# Patient Record
Sex: Female | Born: 1950 | Race: White | Hispanic: No | Smoking: Former smoker
Health system: Southern US, Community
[De-identification: ages and names within clinical notes are randomized; demographics above are authoritative.]

## PROBLEM LIST (undated history)

## (undated) DIAGNOSIS — G4730 Sleep apnea, unspecified: Secondary | ICD-10-CM

## (undated) DIAGNOSIS — J45909 Unspecified asthma, uncomplicated: Secondary | ICD-10-CM

## (undated) DIAGNOSIS — C439 Malignant melanoma of skin, unspecified: Secondary | ICD-10-CM

## (undated) DIAGNOSIS — Z972 Presence of dental prosthetic device (complete) (partial): Secondary | ICD-10-CM

## (undated) DIAGNOSIS — M549 Dorsalgia, unspecified: Secondary | ICD-10-CM

## (undated) DIAGNOSIS — M1990 Unspecified osteoarthritis, unspecified site: Secondary | ICD-10-CM

## (undated) DIAGNOSIS — I10 Essential (primary) hypertension: Secondary | ICD-10-CM

## (undated) HISTORY — PX: BREAST SURGERY: SHX581

## (undated) HISTORY — PX: BREAST BIOPSY: SHX20

## (undated) HISTORY — PX: TONSILLECTOMY: SUR1361

## (undated) HISTORY — PX: FISSURECTOMY: SHX5244

## (undated) HISTORY — PX: KNEE SURGERY: SHX244

---

## 1993-09-07 HISTORY — PX: BREAST EXCISIONAL BIOPSY: SUR124

## 2013-01-26 ENCOUNTER — Ambulatory Visit: Payer: Self-pay | Admitting: Nurse Practitioner

## 2013-02-03 ENCOUNTER — Ambulatory Visit: Payer: Self-pay | Admitting: Nurse Practitioner

## 2013-02-22 ENCOUNTER — Ambulatory Visit: Payer: Self-pay | Admitting: Nurse Practitioner

## 2013-03-20 ENCOUNTER — Ambulatory Visit: Payer: Self-pay | Admitting: Gastroenterology

## 2013-03-21 LAB — PATHOLOGY REPORT

## 2013-08-20 ENCOUNTER — Ambulatory Visit: Payer: Self-pay

## 2014-01-01 ENCOUNTER — Ambulatory Visit: Payer: Self-pay | Admitting: Unknown Physician Specialty

## 2014-03-02 ENCOUNTER — Ambulatory Visit: Payer: Self-pay | Admitting: Unknown Physician Specialty

## 2014-08-09 DIAGNOSIS — C439 Malignant melanoma of skin, unspecified: Secondary | ICD-10-CM

## 2014-08-09 DIAGNOSIS — D039 Melanoma in situ, unspecified: Secondary | ICD-10-CM | POA: Insufficient documentation

## 2014-08-09 HISTORY — DX: Malignant melanoma of skin, unspecified: C43.9

## 2014-08-21 ENCOUNTER — Ambulatory Visit: Payer: Self-pay | Admitting: Nurse Practitioner

## 2014-08-28 ENCOUNTER — Ambulatory Visit: Payer: Self-pay | Admitting: Nurse Practitioner

## 2015-07-10 ENCOUNTER — Other Ambulatory Visit: Payer: Self-pay | Admitting: Gastroenterology

## 2015-07-10 DIAGNOSIS — R1030 Lower abdominal pain, unspecified: Secondary | ICD-10-CM

## 2015-07-15 ENCOUNTER — Ambulatory Visit
Admission: RE | Admit: 2015-07-15 | Discharge: 2015-07-15 | Disposition: A | Discharge status: Home / Self Care | Payer: Medicare Other | Source: Ambulatory Visit | Attending: Gastroenterology | Admitting: Gastroenterology

## 2015-07-15 DIAGNOSIS — R1030 Lower abdominal pain, unspecified: Secondary | ICD-10-CM | POA: Insufficient documentation

## 2015-07-27 ENCOUNTER — Ambulatory Visit
Admission: EM | Admit: 2015-07-27 | Discharge: 2015-07-27 | Disposition: A | Discharge status: Home / Self Care | Payer: Medicare Other | Attending: Internal Medicine | Admitting: Internal Medicine

## 2015-07-27 ENCOUNTER — Encounter: Payer: Self-pay | Admitting: Gynecology

## 2015-07-27 DIAGNOSIS — M545 Low back pain, unspecified: Secondary | ICD-10-CM

## 2015-07-27 HISTORY — DX: Unspecified asthma, uncomplicated: J45.909

## 2015-07-27 HISTORY — DX: Dorsalgia, unspecified: M54.9

## 2015-07-27 HISTORY — DX: Essential (primary) hypertension: I10

## 2015-07-27 LAB — URINALYSIS COMPLETE WITH MICROSCOPIC (ARMC ONLY)
Bilirubin Urine: NEGATIVE
GLUCOSE, UA: NEGATIVE mg/dL
KETONES UR: NEGATIVE mg/dL
Leukocytes, UA: NEGATIVE
Nitrite: NEGATIVE
Protein, ur: NEGATIVE mg/dL
SPECIFIC GRAVITY, URINE: 1.025 (ref 1.005–1.030)
pH: 5 (ref 5.0–8.0)

## 2015-07-27 NOTE — Discharge Instructions (Signed)
Sacroiliac Joint Dysfunction Sacroiliac joint dysfunction is a condition that causes inflammation on one or both sides of the sacroiliac (SI) joint. The SI joint connects the lower part of the spine (sacrum) with the two upper portions of the pelvis (ilium). This condition causes deep aching or burning pain in the low back. In some cases, the pain may also spread into one or both buttocks or hips or spread down the legs. CAUSES This condition may be caused by:  Pregnancy. During pregnancy, extra stress is put on the SI joints because the pelvis widens.  Injury, such as:  Car accidents.  Sport-related injuries.  Work-related injuries.  Having one leg that is shorter than the other.  Conditions that affect the joints, such as:  Rheumatoid arthritis.  Gout.  Psoriatic arthritis.  Joint infection (septic arthritis). Sometimes, the cause of SI joint dysfunction is not known. SYMPTOMS Symptoms of this condition include:  Aching or burning pain in the lower back. The pain may also spread to other areas, such as:  Buttocks.  Groin.  Thighs and legs.  Muscle spasms in or around the painful areas.  Increased pain when standing, walking, running, stair climbing, bending, or lifting. DIAGNOSIS Your health care provider will do a physical exam and take your medical history. During the exam, the health care provider may move one or both of your legs to different positions to check for pain. Various tests may be done to help verify the diagnosis, including:  Imaging tests to look for other causes of pain. These may include:  MRI.  CT scan.  Bone scan.  Diagnostic injection. A numbing medicine is injected into the SI joint using a needle. If the pain is temporarily improved or stopped after the injection, this can indicate that SI joint dysfunction is the problem. TREATMENT Treatment may vary depending on the cause and severity of your condition. Treatment options may  include:  Applying ice or heat to the lower back area. This can help to reduce pain and muscle spasms.  Medicines to relieve pain or inflammation or to relax the muscles.  Wearing a back brace (sacroiliac brace) to help support the joint while your back is healing.  Physical therapy to increase muscle strength around the joint and flexibility at the joint. This may also involve learning proper body positions and ways of moving to relieve stress on the joint.  Direct manipulation of the SI joint.  Injections of steroid medicine into the joint in order to reduce pain and swelling.  Radiofrequency ablation to burn away nerves that are carrying pain messages from the joint.  Use of a device that provides electrical stimulation in order to reduce pain at the joint.  Surgery to put in screws and plates that limit or prevent joint motion. This is rare. HOME CARE INSTRUCTIONS  Rest as needed. Limit your activities as directed by your health care provider.  Take medicines only as directed by your health care provider.  If directed, apply ice to the affected area:  Put ice in a plastic bag.  Place a towel between your skin and the bag.  Leave the ice on for 20 minutes, 2-3 times per day.  Use a heating pad or a moist heat pack as directed by your health care provider.  Exercise as directed by your health care provider or physical therapist.  Keep all follow-up visits as directed by your health care provider. This is important. SEEK MEDICAL CARE IF:  Your pain is not controlled   with medicine.  You have a fever.  You have increasingly severe pain. SEEK IMMEDIATE MEDICAL CARE IF:  You have weakness, numbness, or tingling in your legs or feet.  You lose control of your bladder or bowel.   This information is not intended to replace advice given to you by your health care provider. Make sure you discuss any questions you have with your health care provider.   Document Released:  11/20/2008 Document Revised: 01/08/2015 Document Reviewed: 05/01/2014 Elsevier Interactive Patient Education 2016 [employer]  

## 2015-07-27 NOTE — ED Notes (Signed)
Patient c/o lower back pain x this am.

## 2015-07-29 LAB — URINE CULTURE

## 2015-07-31 ENCOUNTER — Encounter: Payer: Self-pay | Admitting: Physician Assistant

## 2015-07-31 NOTE — ED Provider Notes (Signed)
CSN: [id number]     Arrival date & time 07/27/15  1509 History   First MD Initiated Contact with Patient 07/27/15 1738     Chief Complaint  Patient presents with  . Back Pain   (Consider location/radiation/quality/duration/timing/severity/associated sxs/prior Treatment) HPI 64 yo F presents concerned about low back pain present since this morning.Denies any episode of  trauma- Aggravated that she is experiencing for "no reason". On closer review her son was to come help with yard chores yesterday and his plans changed.   Ms. Goldsborough then opted to move the large potted plants that were on the porch into the wheelbarrow and then into the house. She later used the push mower to mow the lawn and blow the leaves away. Today she awakened with low back discomfort, particularly aware of the right side- though denies numbness, tingling or paresthesia. She is right hand dominant. Has history of previous low back ailments as well as asthma and HTN. Has used a heating pad with some relief . Has taken cyclobenzaprine and "oxy-something" that she has kept "in stockpile " in her closet from previous low back episodes. No frequency or dysuria ,no N/V/D. No saddle paresthesia for difficulty with bowel or bladder function  Past Medical History  Diagnosis Date  . Hypertension   . Asthma   . Back pain   . Back pain    Past Surgical History  Procedure Laterality Date  . Tonsillectomy    . Fissurectomy    . Cesarean section    . Breast surgery    . Knee surgery     No family history on file. Social History  Substance Use Topics  . Smoking status: Never Smoker   . Smokeless tobacco: None  . Alcohol Use: Yes   OB History    Gravida Para Term Preterm AB TAB SAB Ectopic Multiple Living   1 1             Review of Systems Constitutional: No fever.  Eyes: No visual changes. ENT:No sore throat. Cardiovascular:Negative for chest pain/palpitations Respiratory: Negative for shortness of  breath Gastrointestinal: No abdominal pain. No nausea,vomiting, diarrhea Genitourinary: Negative for dysuria. Normal urination. Musculoskeletal: Positive for right low back pain. FROM extremities without pain; good cap fill; leg and foot negative Skin: Negative for rash Neurological: Negative for headache, focal weakness or numbness- no sensation abnormalitis  Allergies  Review of patient's allergies indicates no known allergies.  Home Medications   Prior to Admission medications   Medication Sig Start Date End Date Taking? Authorizing Provider  Acetaminophen (TYLENOL 8 HOUR PO) Take by mouth.   Yes Historical Provider, MD  cyclobenzaprine (FLEXERIL) 5 MG tablet Take 5 mg by mouth 3 (three) times daily as needed for muscle spasms.   Yes Historical Provider, MD  fluticasone (FLONASE) 50 MCG/ACT nasal spray Place into both nostrils daily.   Yes Historical Provider, MD  Fluticasone-Salmeterol (ADVAIR) 500-50 MCG/DOSE AEPB Inhale 1 puff into the lungs 2 (two) times daily.   Yes Historical Provider, MD  lisinopril (PRINIVIL,ZESTRIL) 30 MG tablet Take 30 mg by mouth daily.   Yes Historical Provider, MD   Meds Ordered and Administered this Visit  Medications - No data to display  BP 120/77 mmHg  Pulse 73  Temp(Src) 98.2 F (36.8 C) (Oral)  Resp 18  Ht 5\' 2"  (1.575 m)  Wt 222 lb (100.699 kg)  BMI 40.59 kg/m2  SpO2 98% No data found.   Physical Exam  General: NAD, does not appear  toxic HEENT:normocephalic,atraumatic, mucous membranes moist,grossly normal hearing Eyes: EOMI, conjunctiva clear, conjugate gaze Neck: supple,no lymphadenopathy Resp : CT A, bilat; normal respiratory effort Back : No CVAT, no paraspinal tenderness; right, low back at pelvic rim palpation elicits mild tenderness, R >L. DTRs and pulses present and equal. SLR negative Card : RRR Abd:  Not distended Skin: no rash, skin intact MSK: no deformities, ambulatory without assistance.can toe walk and heel walk, flex  at waist and toe touch to halfway. Neuro : good attention,recall-good memory, no focal neuro deficits Psych: speech and behavior appropriate   ED Course  Procedures (including critical care time)  Labs Review Labs Reviewed  URINALYSIS COMPLETEWITH MICROSCOPIC ([place]) - Abnormal; Notable for the following:    Hgb urine dipstick TRACE (*)    Bacteria, UA FEW (*)    Squamous Epithelial / LPF 0-5 (*)    All other components within normal limits  URINE CULTURE    Imaging Review No results found.  Urine negative for infection... Time and tylenol...limited activitiy , advancing as tolerated Low back precautions reviewed--Ice encouraged, heat to follow -  Body mechanics reviewed May use pain meds from previous experiences as needeed/directed Do not drive with pain meds... Advance activities as able without aggravating discomfort- no rotation and weight lifting Anti-inflammatory encouraged greater than narcotic intervention TC MMUC or PCP is persists greater than 2-3 weeks even with careful bodymechanics        MDM   1. Right-sided low back pain without sciatica   Plan: Diagnosis reviewed with patient/parent/guardian/caregiver  Rx as per orders;  benefits, risks, potential side effects reviewed   Recommend supportive treatment with cyclic tylenol and ibuprofen Seek additional medical care if symptoms worsen or are not improving     Jan Fireman, PA-C 07/31/15 2132

## 2015-08-21 ENCOUNTER — Other Ambulatory Visit: Payer: Self-pay | Admitting: Nurse Practitioner

## 2015-08-21 DIAGNOSIS — Z1231 Encounter for screening mammogram for malignant neoplasm of breast: Secondary | ICD-10-CM

## 2015-08-26 ENCOUNTER — Other Ambulatory Visit: Payer: Self-pay | Admitting: Gastroenterology

## 2015-08-26 DIAGNOSIS — R1031 Right lower quadrant pain: Secondary | ICD-10-CM

## 2015-08-26 DIAGNOSIS — R1032 Left lower quadrant pain: Secondary | ICD-10-CM

## 2015-08-29 ENCOUNTER — Ambulatory Visit
Admission: RE | Admit: 2015-08-29 | Discharge: 2015-08-29 | Disposition: A | Discharge status: Home / Self Care | Payer: Medicare Other | Source: Ambulatory Visit | Attending: Gastroenterology | Admitting: Gastroenterology

## 2015-08-29 DIAGNOSIS — K5730 Diverticulosis of large intestine without perforation or abscess without bleeding: Secondary | ICD-10-CM | POA: Insufficient documentation

## 2015-08-29 DIAGNOSIS — R1032 Left lower quadrant pain: Secondary | ICD-10-CM

## 2015-08-29 DIAGNOSIS — R1031 Right lower quadrant pain: Secondary | ICD-10-CM | POA: Diagnosis present

## 2015-08-29 HISTORY — DX: Malignant melanoma of skin, unspecified: C43.9

## 2015-08-29 MED ORDER — IOHEXOL 350 MG/ML SOLN
100.0000 mL | Freq: Once | INTRAVENOUS | Status: AC | PRN
  Administered 2015-08-29: 100 mL via INTRAVENOUS

## 2015-09-11 ENCOUNTER — Ambulatory Visit
Admission: RE | Admit: 2015-09-11 | Discharge: 2015-09-11 | Disposition: A | Discharge status: Home / Self Care | Payer: Medicare Other | Source: Ambulatory Visit | Attending: Nurse Practitioner | Admitting: Nurse Practitioner

## 2015-09-11 DIAGNOSIS — Z1231 Encounter for screening mammogram for malignant neoplasm of breast: Secondary | ICD-10-CM

## 2015-09-11 DIAGNOSIS — N63 Unspecified lump in breast: Secondary | ICD-10-CM | POA: Insufficient documentation

## 2015-09-12 ENCOUNTER — Other Ambulatory Visit: Payer: Self-pay | Admitting: Nurse Practitioner

## 2015-09-12 DIAGNOSIS — N63 Unspecified lump in unspecified breast: Secondary | ICD-10-CM

## 2015-09-17 DIAGNOSIS — F32A Depression, unspecified: Secondary | ICD-10-CM | POA: Insufficient documentation

## 2015-09-17 DIAGNOSIS — I10 Essential (primary) hypertension: Secondary | ICD-10-CM | POA: Insufficient documentation

## 2015-09-24 ENCOUNTER — Ambulatory Visit: Admission: RE | Admit: 2015-09-24 | Payer: Medicare Other | Source: Ambulatory Visit

## 2015-09-24 ENCOUNTER — Other Ambulatory Visit: Payer: Medicare Other

## 2015-10-08 ENCOUNTER — Ambulatory Visit
Admission: RE | Admit: 2015-10-08 | Discharge: 2015-10-08 | Disposition: A | Discharge status: Home / Self Care | Payer: Medicare Other | Source: Ambulatory Visit | Attending: Nurse Practitioner | Admitting: Nurse Practitioner

## 2015-10-08 DIAGNOSIS — N63 Unspecified lump in unspecified breast: Secondary | ICD-10-CM

## 2015-10-08 HISTORY — PX: BREAST BIOPSY: SHX20

## 2015-10-09 LAB — SURGICAL PATHOLOGY

## 2015-11-22 ENCOUNTER — Encounter: Payer: Self-pay | Admitting: Anesthesiology

## 2015-11-22 ENCOUNTER — Ambulatory Visit
Admission: RE | Admit: 2015-11-22 | Discharge: 2015-11-22 | Disposition: A | Discharge status: Home / Self Care | Payer: Medicare Other | Source: Ambulatory Visit | Attending: Gastroenterology | Admitting: Gastroenterology

## 2015-11-22 ENCOUNTER — Encounter
Admission: RE | Disposition: A | Discharge status: Home / Self Care | Payer: Self-pay | Source: Ambulatory Visit | Attending: Gastroenterology

## 2015-11-22 ENCOUNTER — Ambulatory Visit: Payer: Medicare Other | Admitting: Anesthesiology

## 2015-11-22 DIAGNOSIS — J45909 Unspecified asthma, uncomplicated: Secondary | ICD-10-CM | POA: Diagnosis not present

## 2015-11-22 DIAGNOSIS — K621 Rectal polyp: Secondary | ICD-10-CM | POA: Diagnosis not present

## 2015-11-22 DIAGNOSIS — K5730 Diverticulosis of large intestine without perforation or abscess without bleeding: Secondary | ICD-10-CM | POA: Diagnosis not present

## 2015-11-22 DIAGNOSIS — Z79899 Other long term (current) drug therapy: Secondary | ICD-10-CM | POA: Diagnosis not present

## 2015-11-22 DIAGNOSIS — R1032 Left lower quadrant pain: Secondary | ICD-10-CM | POA: Insufficient documentation

## 2015-11-22 DIAGNOSIS — Z85820 Personal history of malignant melanoma of skin: Secondary | ICD-10-CM | POA: Diagnosis not present

## 2015-11-22 DIAGNOSIS — Z9889 Other specified postprocedural states: Secondary | ICD-10-CM | POA: Insufficient documentation

## 2015-11-22 DIAGNOSIS — I10 Essential (primary) hypertension: Secondary | ICD-10-CM | POA: Insufficient documentation

## 2015-11-22 DIAGNOSIS — Z7951 Long term (current) use of inhaled steroids: Secondary | ICD-10-CM | POA: Insufficient documentation

## 2015-11-22 HISTORY — PX: COLONOSCOPY WITH PROPOFOL: SHX5780

## 2015-11-22 SURGERY — COLONOSCOPY WITH PROPOFOL
Anesthesia: General

## 2015-11-22 MED ORDER — SODIUM CHLORIDE 0.9 % IV SOLN
INTRAVENOUS | Status: DC
  Administered 2015-11-22 (×2): via INTRAVENOUS

## 2015-11-22 MED ORDER — PROPOFOL 500 MG/50ML IV EMUL
INTRAVENOUS | Status: DC | PRN
  Administered 2015-11-22: 180 ug/kg/min via INTRAVENOUS

## 2015-11-22 MED ORDER — FENTANYL CITRATE (PF) 100 MCG/2ML IJ SOLN
INTRAMUSCULAR | Status: DC | PRN
  Administered 2015-11-22: 50 ug via INTRAVENOUS

## 2015-11-22 MED ORDER — EPHEDRINE SULFATE 50 MG/ML IJ SOLN
INTRAMUSCULAR | Status: DC | PRN
  Administered 2015-11-22 (×2): 10 mg via INTRAVENOUS

## 2015-11-22 MED ORDER — SODIUM CHLORIDE 0.9 % IV SOLN
INTRAVENOUS | Status: DC
Start: 2015-11-22 — End: 2015-11-22

## 2015-11-22 MED ORDER — MIDAZOLAM HCL 5 MG/5ML IJ SOLN
INTRAMUSCULAR | Status: DC | PRN
  Administered 2015-11-22: 2 mg via INTRAVENOUS

## 2015-11-22 MED ORDER — LIDOCAINE HCL (CARDIAC) 20 MG/ML IV SOLN
INTRAVENOUS | Status: DC | PRN
  Administered 2015-11-22: 40 mg via INTRAVENOUS

## 2015-11-22 MED ORDER — PHENYLEPHRINE HCL 10 MG/ML IJ SOLN
INTRAMUSCULAR | Status: DC | PRN
  Administered 2015-11-22: 100 ug via INTRAVENOUS

## 2015-11-22 MED ORDER — PROPOFOL 10 MG/ML IV BOLUS
INTRAVENOUS | Status: DC | PRN
  Administered 2015-11-22: 50 mg via INTRAVENOUS

## 2015-11-22 NOTE — Transfer of Care (Signed)
Immediate Anesthesia Transfer of Care Note  Patient: Terri Hamilton  Procedure(s) Performed: Procedure(s): COLONOSCOPY WITH PROPOFOL (N/A)  Patient Location: PACU  Anesthesia Type:General  Level of Consciousness: awake  Airway & Oxygen Therapy: Patient Spontanous Breathing and Patient connected to nasal cannula oxygen  Post-op Assessment: Report given to RN and Post -op Vital signs reviewed and stable  Post vital signs: Reviewed and stable  Last Vitals:  Filed Vitals:   11/22/15 0912 11/22/15 1017  BP: 153/78 96/51  Pulse: 76 88  Temp: 36.9 C 35.9 C  Resp: 18 15    Complications: No apparent anesthesia complications

## 2015-11-22 NOTE — Op Note (Signed)
[facility] Gastroenterology Patient Name: Terri Hamilton Procedure Date: 11/22/2015 9:38 AM MRN: [id number] Account #: [id number] Date of Birth: 18-Jul-1951 Admit Type: Outpatient Age: 65 Room: [facility] ENDO ROOM 3 Gender: Female Note Status: Finalized Procedure:            Colonoscopy Indications:          Abdominal pain in the left lower quadrant, Abnormal CT                        of the GI tract Providers:            Lollie Sails, MD Referring MD:         Juluis Rainier (Referring MD) Medicines:            Monitored Anesthesia Care Complications:        No immediate complications. Procedure:            Pre-Anesthesia Assessment:                       - ASA Grade Assessment: III - A patient with severe                        systemic disease.                       After obtaining informed consent, the colonoscope was                        passed under direct vision. Throughout the procedure,                        the patient's blood pressure, pulse, and oxygen                        saturations were monitored continuously. The                        Colonoscope was introduced through the anus and                        advanced to the the cecum, identified by appendiceal                        orifice and ileocecal valve. The quality of the bowel                        preparation was good. Findings:      A 2 mm polyp was found in the rectum. The polyp was sessile. The polyp       was removed with a cold biopsy forceps. Resection and retrieval were       complete.      Multiple small and large-mouthed diverticula were found in the sigmoid       colon, descending colon and transverse colon.      A single medium-mouthed diverticulum was found in the mid sigmoid colon.       Peri-diverticular erythema was seen.      There is some mild lumenal narrowing in the distal to mid sigmoid.       without evidence of inflammation or overt stricture.      The  digital rectal exam was  normal. Pertinent negatives include note       poor sphhincter tone. Impression:           - One 2 mm polyp in the rectum, removed with a cold                        biopsy forceps. Resected and retrieved.                       - Diverticulosis in the sigmoid colon, in the                        descending colon and in the transverse colon.                       - Mild diverticulosis in the mid sigmoid colon.                        Peri-diverticular erythema was seen. Recommendation:       - Trial dose of immodium, 2 mg daily, and one dose of                        citrucel daily for reported fecal incontinence.                       - Return to GI clinic in 1 month. Procedure Code(s):    --- Professional ---                       [id number], Colonoscopy, flexible; with biopsy, single or                        multiple Diagnosis Code(s):    --- Professional ---                       K62.1, Rectal polyp                       R10.32, Left lower quadrant pain                       K57.30, Diverticulosis of large intestine without                        perforation or abscess without bleeding                       R93.3, Abnormal findings on diagnostic imaging of other                        parts of digestive tract CPT copyright 2016 American Medical Association. All rights reserved. The codes documented in this report are preliminary and upon coder review may  be revised to meet current compliance requirements. Lollie Sails, MD 11/22/2015 10:17:42 AM This report has been signed electronically. Number of Addenda: 0 Note Initiated On: 11/22/2015 9:38 AM Scope Withdrawal Time: 0 hours 8 minutes 7 seconds  Total Procedure Duration: 0 hours 17 minutes 59 seconds       [facility]

## 2015-11-22 NOTE — Anesthesia Postprocedure Evaluation (Signed)
Anesthesia Post Note  Patient: Terri Hamilton  Procedure(s) Performed: Procedure(s) (LRB): COLONOSCOPY WITH PROPOFOL (N/A)  Patient location during evaluation: Endoscopy Anesthesia Type: General Level of consciousness: awake and alert Pain management: pain level controlled Vital Signs Assessment: post-procedure vital signs reviewed and stable Respiratory status: spontaneous breathing, nonlabored ventilation, respiratory function stable and patient connected to nasal cannula oxygen Cardiovascular status: blood pressure returned to baseline and stable Postop Assessment: no signs of nausea or vomiting Anesthetic complications: no    Last Vitals:  Filed Vitals:   11/22/15 1030 11/22/15 1040  BP: 107/66 109/72  Pulse: 86   Temp:    Resp: 17     Last Pain:  Filed Vitals:   11/22/15 1049  PainSc: 4                  Ikeisha Blumberg K Luanna Weesner

## 2015-11-22 NOTE — Anesthesia Preprocedure Evaluation (Addendum)
Anesthesia Evaluation  Patient identified by MRN, date of birth, ID band Patient awake    Reviewed: Allergy & Precautions, H&P , NPO status , Patient's Chart, lab work & pertinent test results  History of Anesthesia Complications (+) AWARENESS UNDER ANESTHESIA and history of anesthetic complications  Airway Mallampati: II  TM Distance: <3 FB Neck ROM: limited    Dental  (+) Poor Dentition   Pulmonary neg shortness of breath, asthma , sleep apnea and Continuous Positive Airway Pressure Ventilation , former smoker,    Pulmonary exam normal breath sounds clear to auscultation       Cardiovascular Exercise Tolerance: Good hypertension, (-) angina(-) Past MI and (-) DOE Normal cardiovascular exam Rhythm:regular Rate:Normal     Neuro/Psych negative neurological ROS  negative psych ROS   GI/Hepatic Neg liver ROS, neg GERD  ,  Endo/Other  negative endocrine ROS  Renal/GU negative Renal ROS  negative genitourinary   Musculoskeletal   Abdominal   Peds  Hematology negative hematology ROS (+)   Anesthesia Other Findings Past Medical History:   Hypertension                                                 Asthma                                                       Back pain                                                    Back pain                                                    Melanoma ([place])                                  08/09/2014      Comment:Resected from Right side of face.  Past Surgical History:   TONSILLECTOMY                                                 FISSURECTOMY                                                  CESAREAN SECTION                                              BREAST SURGERY  KNEE SURGERY                                                  BREAST BIOPSY                                   Left                Comment:neg   BREAST BIOPSY                                    Bilateral 10/08/2015      Comment:path pending  BMI    Body Mass Index   40.59 kg/m 2      Reproductive/Obstetrics negative OB ROS                            Anesthesia Physical Anesthesia Plan  ASA: III  Anesthesia Plan: General   Post-op Pain Management:    Induction:   Airway Management Planned:   Additional Equipment:   Intra-op Plan:   Post-operative Plan:   Informed Consent: I have reviewed the patients History and Physical, chart, labs and discussed the procedure including the risks, benefits and alternatives for the proposed anesthesia with the patient or authorized representative who has indicated his/her understanding and acceptance.   Dental Advisory Given  Plan Discussed with: Anesthesiologist, CRNA and Surgeon  Anesthesia Plan Comments:         Anesthesia Quick Evaluation

## 2015-11-22 NOTE — H&P (Signed)
Outpatient short stay form Pre-procedure 11/22/2015 9:32 AM Lollie Sails MD  Primary Physician: Mercy Riding, NP  Reason for visit:  Colonoscopy  History of present illness:  Patient is a 65 year old female presenting today for colonoscopy. She has a history of recurrent diverticulitis and has been having some left lower quadrant pain. A CT scan was done on 08/29/2015 with a finding of some thickening in the sigmoid region without evidence of active diverticulitis. She tolerated her prep well. He takes no aspirin or blood thinning products.    Current facility-administered medications:  .  0.9 %  sodium chloride infusion, , Intravenous, Continuous, Lollie Sails, MD, Last Rate: 20 mL/hr at 11/22/15 0925 .  0.9 %  sodium chloride infusion, , Intravenous, Continuous, Lollie Sails, MD  Prescriptions prior to admission  Medication Sig Dispense Refill Last Dose  . escitalopram (LEXAPRO) 20 MG tablet Take 20 mg by mouth daily.   11/22/2015 at [place]  . fluticasone (FLONASE) 50 MCG/ACT nasal spray Place into both nostrils daily.   11/22/2015 at [place]  . Fluticasone-Salmeterol (ADVAIR) 500-50 MCG/DOSE AEPB Inhale 1 puff into the lungs 2 (two) times daily.   11/22/2015 at [place]  . lisinopril (PRINIVIL,ZESTRIL) 30 MG tablet Take 30 mg by mouth daily.   11/22/2015 at [place]  . Acetaminophen (TYLENOL 8 HOUR PO) Take by mouth.     . cyclobenzaprine (FLEXERIL) 5 MG tablet Take 5 mg by mouth 3 (three) times daily as needed for muscle spasms.        No Known Allergies   Past Medical History  Diagnosis Date  . Hypertension   . Asthma   . Back pain   . Back pain   . Melanoma ([place]) 08/09/2014    Resected from Right side of face.    Review of systems:      Physical Exam    Heart and lungs: Regular rate and rhythm without rub or gallop, lungs are bilaterally clear.    HEENT: Normocephalic atraumatic eyes are anicteric    Other:     Pertinant exam for procedure: Soft nontender  nondistended bowel sounds positive normoactive    Planned proceedures: Colonoscopy and indicated procedures. I have discussed the risks benefits and complications of procedures to include not limited to bleeding, infection, perforation and the risk of sedation and the patient wishes to proceed.    Lollie Sails, MD Gastroenterology 11/22/2015  9:32 AM

## 2015-11-25 LAB — SURGICAL PATHOLOGY

## 2015-11-27 ENCOUNTER — Encounter: Payer: Self-pay | Admitting: Gastroenterology

## 2016-08-24 ENCOUNTER — Other Ambulatory Visit: Payer: Self-pay | Admitting: Nurse Practitioner

## 2016-08-24 DIAGNOSIS — Z1231 Encounter for screening mammogram for malignant neoplasm of breast: Secondary | ICD-10-CM

## 2016-09-06 IMAGING — MG MM DIAG BREAST TOMO BILATERAL
8 of 12 series · 8 of 28 positions shown · non-contrast
Comparison: Previous exam(s).

CLINICAL DATA: Patient for short-term follow-up probably benign
left breast mass.

EXAM:
DIGITAL DIAGNOSTIC BILATERAL MAMMOGRAM WITH 3D TOMOSYNTHESIS WITH
CAD
ULTRASOUND BILATERAL BREAST

[R MLO synth-2D]
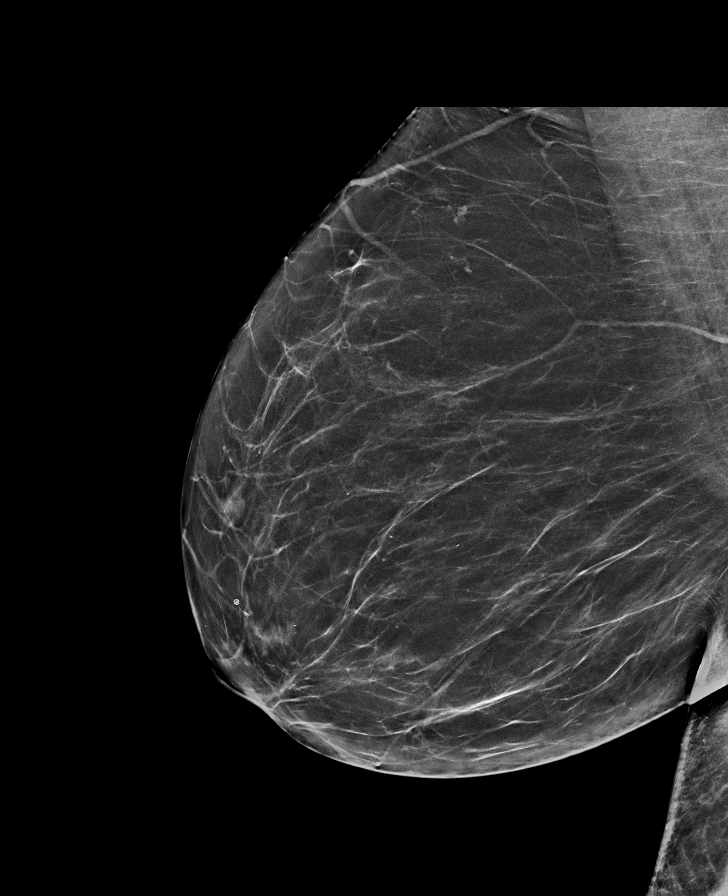

[L CC]
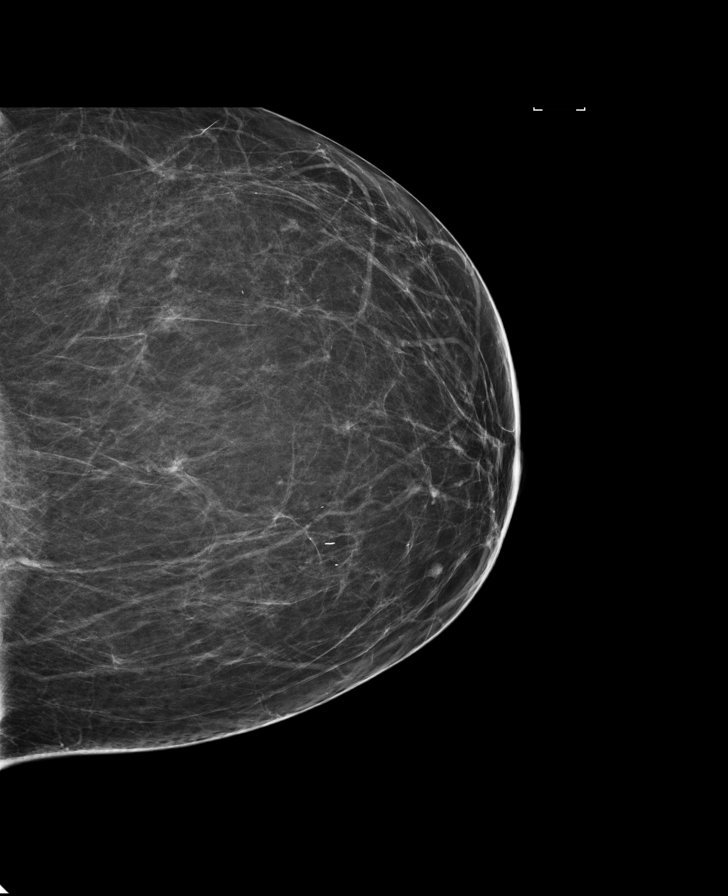

[R CC synth-2D]
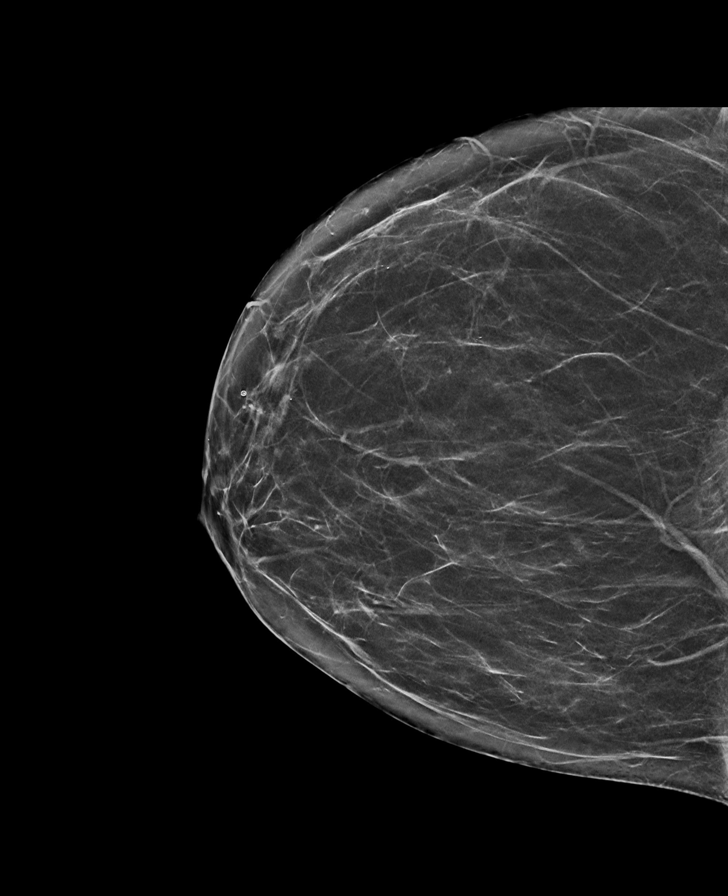

[R CC]
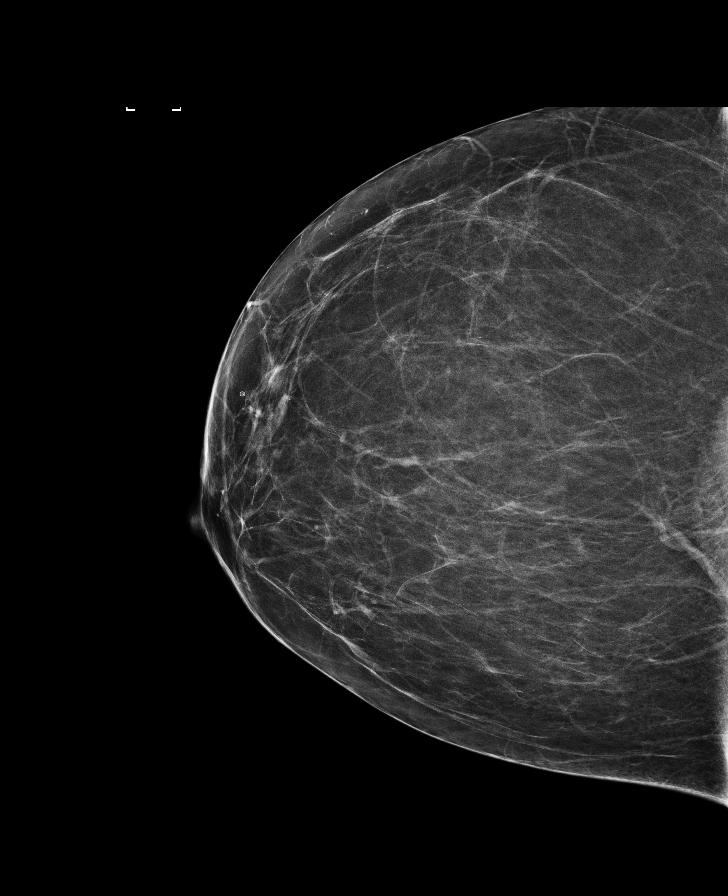

[L MLO synth-2D]
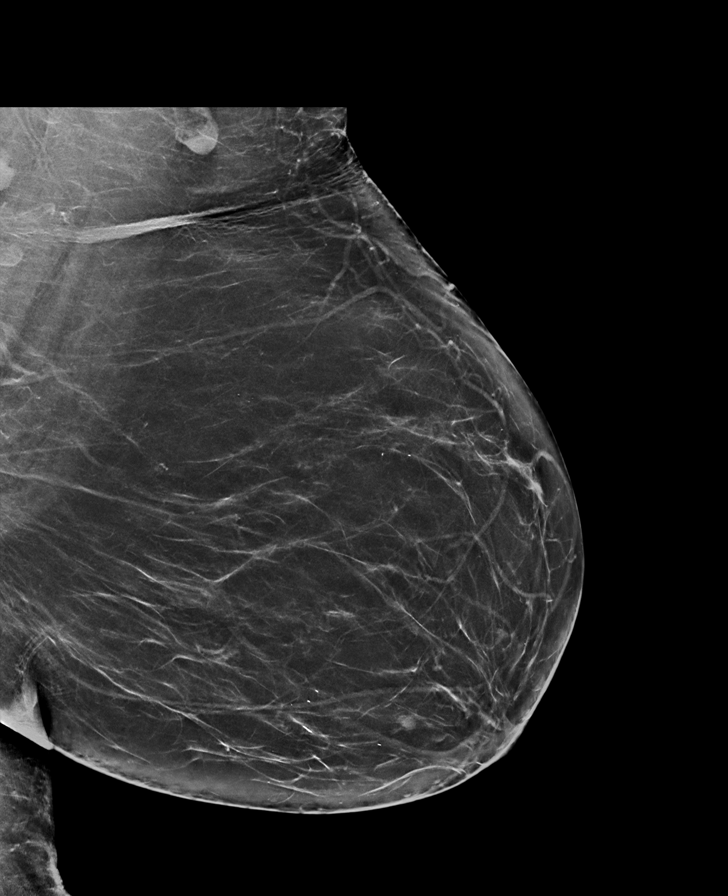

[L CC synth-2D]
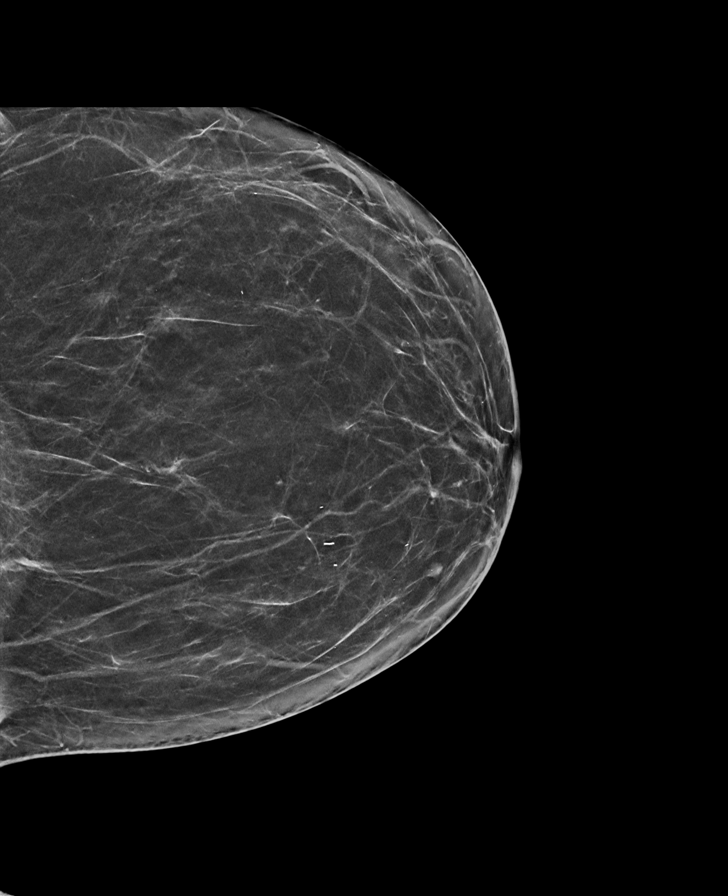

[R MLO]
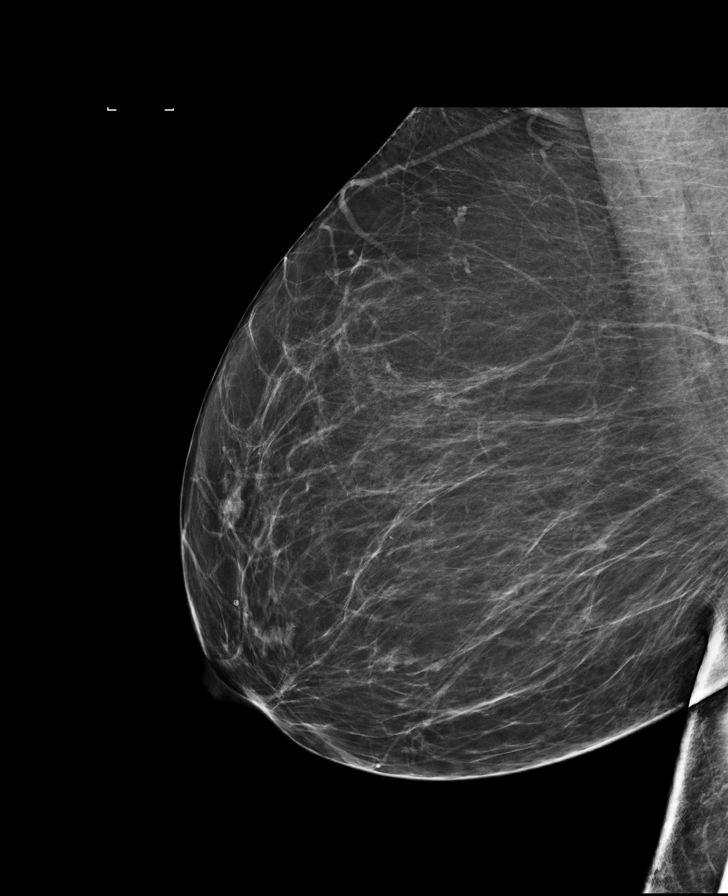

[L MLO]
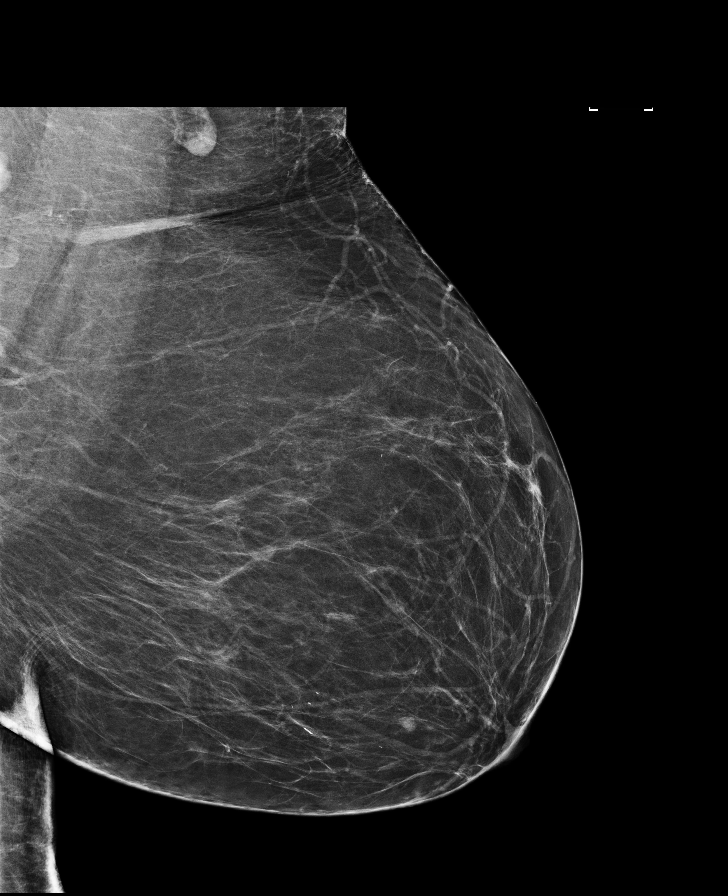

[8 of 28 positions shown; findings below may reference images not displayed]

ACR Breast Density Category b: There are scattered areas of
fibroglandular density.
FINDINGS: No significant interval change in size of oval circumscribed mass
within the upper inner left breast. Additionally within the inferior
right breast 6- 7 o'clock position there is a new oval circumscribed
mass measuring 8 mm. No additional concerning masses, calcifications
or architectural distortion identified within either breast.

Mammographic images were processed with CAD.

On physical exam, I palpate no discrete mass within the upper inner
left breast or inferior right breast.

Targeted ultrasound is performed, showing a 5 x 3 x 5 mm
circumscribed mixed echogenicity mass within the left breast 10
o'clock position 2 cm from nipple, not significantly changed in size
from prior however appears to be more solid when compared to prior
examination.

No left axillary lymphadenopathy.

Within the right breast 7 o'clock position 4 cm from the nipple
there is a 6 x 2 x 5 mm lobular hypoechoic mass with internal color
flow. No right axillary lymphadenopathy.
IMPRESSION: New indeterminate hypoechoic right breast mass.

Indeterminate hypoechoic left breast mass.

RECOMMENDATION:
Ultrasound-guided core needle biopsy new right breast mass.

Ultrasound-guided core needle biopsy solid left breast mass.

Biopsy will be scheduled at patient's convenience.

I have discussed the findings and recommendations with the patient.
Results were also provided in writing at the conclusion of the
visit. If applicable, a reminder letter will be sent to the patient
regarding the next appointment.

BI-RADS CATEGORY  4: Suspicious.

## 2017-08-03 ENCOUNTER — Other Ambulatory Visit: Payer: Self-pay | Admitting: Nurse Practitioner

## 2017-08-03 DIAGNOSIS — N6310 Unspecified lump in the right breast, unspecified quadrant: Secondary | ICD-10-CM

## 2017-09-10 ENCOUNTER — Ambulatory Visit
Admission: RE | Admit: 2017-09-10 | Discharge: 2017-09-10 | Disposition: A | Discharge status: Home / Self Care | Payer: Medicare Other | Source: Ambulatory Visit | Attending: Nurse Practitioner | Admitting: Nurse Practitioner

## 2017-09-10 DIAGNOSIS — N6310 Unspecified lump in the right breast, unspecified quadrant: Secondary | ICD-10-CM | POA: Diagnosis not present

## 2018-08-12 ENCOUNTER — Other Ambulatory Visit: Payer: Self-pay | Admitting: Nurse Practitioner

## 2018-08-12 DIAGNOSIS — Z1231 Encounter for screening mammogram for malignant neoplasm of breast: Secondary | ICD-10-CM

## 2018-09-13 ENCOUNTER — Ambulatory Visit
Admission: RE | Admit: 2018-09-13 | Discharge: 2018-09-13 | Disposition: A | Discharge status: Home / Self Care | Payer: Medicare Other | Source: Ambulatory Visit | Attending: Nurse Practitioner | Admitting: Nurse Practitioner

## 2018-09-13 DIAGNOSIS — Z1231 Encounter for screening mammogram for malignant neoplasm of breast: Secondary | ICD-10-CM | POA: Diagnosis present

## 2019-09-12 ENCOUNTER — Other Ambulatory Visit (INDEPENDENT_AMBULATORY_CARE_PROVIDER_SITE_OTHER): Payer: Self-pay | Admitting: Gastroenterology

## 2019-10-03 ENCOUNTER — Other Ambulatory Visit: Payer: Self-pay | Admitting: Nurse Practitioner

## 2019-10-03 DIAGNOSIS — Z1231 Encounter for screening mammogram for malignant neoplasm of breast: Secondary | ICD-10-CM

## 2019-10-31 ENCOUNTER — Ambulatory Visit
Admission: RE | Admit: 2019-10-31 | Discharge: 2019-10-31 | Disposition: A | Discharge status: Home / Self Care | Payer: Medicare Other | Source: Ambulatory Visit | Attending: Nurse Practitioner | Admitting: Nurse Practitioner

## 2019-10-31 DIAGNOSIS — Z1231 Encounter for screening mammogram for malignant neoplasm of breast: Secondary | ICD-10-CM | POA: Diagnosis present

## 2020-01-15 ENCOUNTER — Encounter: Payer: Self-pay | Admitting: Physical Therapy

## 2020-01-15 ENCOUNTER — Ambulatory Visit: Payer: Medicare Other | Attending: Nurse Practitioner | Admitting: Physical Therapy

## 2020-01-15 ENCOUNTER — Other Ambulatory Visit: Payer: Self-pay

## 2020-01-15 DIAGNOSIS — R278 Other lack of coordination: Secondary | ICD-10-CM

## 2020-01-15 DIAGNOSIS — M6281 Muscle weakness (generalized): Secondary | ICD-10-CM | POA: Diagnosis present

## 2020-01-15 DIAGNOSIS — R293 Abnormal posture: Secondary | ICD-10-CM | POA: Diagnosis present
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
## 2020-01-22 ENCOUNTER — Ambulatory Visit: Payer: Medicare Other | Admitting: Physical Therapy

## 2020-01-22 ENCOUNTER — Other Ambulatory Visit: Payer: Self-pay

## 2020-01-22 ENCOUNTER — Encounter: Payer: Self-pay | Admitting: Physical Therapy

## 2020-01-22 DIAGNOSIS — R293 Abnormal posture: Secondary | ICD-10-CM

## 2020-01-22 DIAGNOSIS — R278 Other lack of coordination: Secondary | ICD-10-CM | POA: Diagnosis not present

## 2020-01-22 DIAGNOSIS — M6281 Muscle weakness (generalized): Secondary | ICD-10-CM

## 2020-01-22 NOTE — Therapy (Signed)
[place] [place] [facility] [address] [place], [place], [address] Phone: [phone]   Fax:  [phone]  Physical Therapy Treatment  Patient Details  Name: Terri Hamilton MRN: [id number] Date of Birth: 06/28/1951 Referring Provider (PT): Gaetano Net   Encounter Date: 01/22/2020  PT End of Session - 01/22/20 1409    Visit Number  2    Number of Visits  12    Date for PT Re-Evaluation  04/08/20    Authorization Type  IE 01/15/2020    PT Start Time  1400    PT Stop Time  1455    PT Time Calculation (min)  55 min    Activity Tolerance  Patient tolerated treatment well    Behavior During Therapy  [facility] for tasks assessed/performed       Past Medical History:  Diagnosis Date  . Asthma   . Back pain   . Back pain   . Hypertension   . Melanoma ([place]) 08/09/2014   Resected from Right side of face.    Past Surgical History:  Procedure Laterality Date  . BREAST BIOPSY Right 10/08/2015   INTRADUCTAL PAPILLOMA   . BREAST BIOPSY Left 10/08/2015   CYST WALL FRAGMENTS AND BLOOD CLOT.   Marland Kitchen BREAST EXCISIONAL BIOPSY Left 1995   neg  . BREAST SURGERY    . CESAREAN SECTION    . COLONOSCOPY WITH PROPOFOL N/A 11/22/2015   Procedure: COLONOSCOPY WITH PROPOFOL;  Surgeon: Lollie Sails, MD;  [facility] END[facility]COPY;  Service: Endoscopy;  Laterality: N/A;  . FISSURECTOMY    . KNEE SURGERY    . TONSILLECTOMY      There were no vitals filed for this visit.  Subjective Assessment - 01/22/20 1401    Subjective  Patient presents to clinic with bladder diary with no distinct patterns for urgency, increased frequency, or incomplete emptying. Patient denies any other observations from bladder diary. Patient states that she did find adjustments to toileting posture to be useful/helpful.    Currently in Pain?  No/denies       TREATMENT  Pre-treatment assessment: RANGE OF MOTION:    LEFT RIGHT  Lumbar forward flexion (65):  WNL    Lumbar extension  (30): WNL    Lumbar lateral flexion (25):  WNL WNL  Thoracic and Lumbar rotation (30 degrees):    WNL WNL  Hip Flexion (0-125):   WNL WNL  Hip IR (0-45):  WNL WNL  Hip ER (0-45):  WNL WNL  Hip Abduction (0-40):  WNL WNL  Hip extension (0-15):  WNL WNL    STRENGTH: MMT   RLE LLE  Hip Flexion 5 5  Hip Extension 5 5  Hip Abduction  5 5  Hip Adduction  5 5  Hip ER  5 5  Hip IR  5 5  Knee Extension 5 5  Knee Flexion 5 5  Dorsiflexion  5 5  Plantarflexion (seated) 5 5   ABDOMINAL:  Palpation: no TTP Diastasis: > 4 finger width at umbilicus; <2 above and below  SPECIAL TESTS:  FABER (SN 81): Negative B FADDIR (SN 94):  Negative B Stork/March (SP 93): Negative B  Neuromuscular Re-education: Patient educated on basics of intra-abdominal pressure, log roll for pressure management with supine <>sit, and abdominal muscle structure for improved understanding/management of diastasis recti. Supine hooklying diaphragmatic breathing with VCs and TCs for PFM coordination and improved management of IAP Supine hooklying diaphragmatic breathing with TCs for diastasis closure Supine hooklying, TrA  activation with exhalation. VCs and TCs to decrease compensatory patterns and encourage diastasis closure.    Patient educated throughout session on appropriate technique and form using multi-modal cueing, HEP, and activity modification. Patient articulated understanding and returned demonstration.  Patient Response to interventions: Patient comfortable with HEP 1-2x/day.  ASSESSMENT Patient presents to clinic with excellent motivation to participate in therapy. Patient demonstrates deficits in posture, pain, gait, PFM coordination, PFM extensibility, PFM strength, scar mobility. Patient able to achieve TrA activation with coordinated breath during today's session and responded positively to educational interventions. Patient will benefit from continued skilled therapeutic intervention to address  remaining deficits in posture, pain, gait, PFM coordination, PFM extensibility, PFM strength, scar mobility in order to increase function and improve overall QOL.    PT Long Term Goals - 01/15/20 1529      PT LONG TERM GOAL #1   Title  Patient will demonstrate independence with HEP in order to maximize therapeutic gains and improve carryover from physical therapy sessions to ADLs in the home and community.    Baseline  IE: not demonstrated    Time  12    Period  Weeks    Status  New    Target Date  04/08/20      PT LONG TERM GOAL #2   Title  Patient will return to vigorous physical activities (biking, brisk walking) with minimal urinary/fecal incontinence at frequency of <25% with activity of duration > 60 minutes for participation in community and wellness activties.    Baseline  IE: 50%+    Time  12    Period  Weeks    Status  New    Target Date  04/08/20      PT LONG TERM GOAL #3   Title  Patient will demonstrate circumferential and sequential contraction of >4/5 MMT, > 6 sec hold x10 and 5 consecutive quick flicks with </= 10 min rest between testing bouts, and relaxation of the PFM coordinated with breath for improved management of intra-abdominal pressure and normal bowel and bladder function without the presence of pain nor incontinence in order to improve participation at home and in the community.    Baseline  IE: not demonstrated    Time  12    Period  Weeks    Status  New    Target Date  04/08/20      PT LONG TERM GOAL #4   Title  Patient will demonstrate coordinated lengthening and relaxation of PFM with diaphragmatic inhalation in order to decrease spasm and allow for unrestricted elimination of urine/feces for improved overall QOL.    Baseline  IE: not demonstrated    Time  12    Period  Weeks    Status  New    Target Date  04/08/20      PT LONG TERM GOAL #5   Title  Patient will demonstrate improved function as evidenced by a score of (Urinary 61; Bowel Leakage  65; Bowel Constipation 64) on FOTO measure for full participation in activities at home and in the community.    Baseline  IE: Urinary 54; Bowel Leakage 57; Bowel Constipation 55    Time  12    Period  Weeks    Status  New    Target Date  04/08/20            Plan - 01/22/20 1409    Clinical Impression Statement  Patient presents to clinic with excellent motivation to participate in therapy. Patient demonstrates  deficits in posture, pain, gait, PFM coordination, PFM extensibility, PFM strength, scar mobility. Patient able to achieve TrA activation with coordinated breath during today's session and responded positively to educational interventions. Patient will benefit from continued skilled therapeutic intervention to address remaining deficits in posture, pain, gait, PFM coordination, PFM extensibility, PFM strength, scar mobility in order to increase function and improve overall QOL.    Personal Factors and Comorbidities  Age;Education;Sex;Comorbidity 3+;Past/Current Experience;Time since onset of injury/illness/exacerbation;Fitness    Comorbidities  asthma, HTN, back pain, depression, hyperlipidemia, anxiety, sleep apnea    Examination-Activity Limitations  Sit;Transfers;Sleep;Bend;Lift;Squat;Locomotion Level;Carry;Continence;Stand;Stairs    Examination-Participation Restrictions  Interpersonal Relationship;Yard Work;Laundry;Cleaning;Community Activity;Shop    Stability/Clinical Decision Making  Evolving/Moderate complexity    Rehab Potential  Fair    PT Frequency  1x / week    PT Duration  12 weeks    PT Treatment/Interventions  ADLs/Self Care Home Management;Biofeedback;Cryotherapy;Moist Heat;Electrical Stimulation;Therapeutic activities;Therapeutic exercise;Balance training;Neuromuscular re-education;Patient/family education;Manual techniques;Scar mobilization;Taping;Dry needling;Passive range of motion;Spinal Manipulations;Joint Manipulations    PT Next Visit Plan  External PFM  assessment; IAP basics    PT Home Exercise Plan  bladder diary; water before coffee    Consulted and Agree with Plan of Care  Patient       Patient will benefit from skilled therapeutic intervention in order to improve the following deficits and impairments:  Abnormal gait, Decreased balance, Decreased endurance, Decreased mobility, Difficulty walking, Postural dysfunction, Improper body mechanics, Decreased strength, Decreased coordination, Decreased activity tolerance, Decreased range of motion, Increased muscle spasms, Impaired tone, Decreased scar mobility, Increased fascial restricitons, Impaired flexibility, Obesity  Visit Diagnosis: Other lack of coordination  Muscle weakness (generalized)  Abnormal posture     Problem List There are no problems to display for this patient.  Myles Gip PT, DPT [phone] 01/22/2020, 3:23 PM  [place] [facility] [address] [place], [place], [address] Phone: [phone]   Fax:  [phone]  Name: Donell Aleem MRN: [id number] Date of Birth: 1951-08-24

## 2020-02-01 ENCOUNTER — Encounter: Payer: Self-pay | Admitting: Physical Therapy

## 2020-02-01 ENCOUNTER — Ambulatory Visit: Payer: Medicare Other | Admitting: Physical Therapy

## 2020-02-01 ENCOUNTER — Other Ambulatory Visit: Payer: Self-pay

## 2020-02-01 DIAGNOSIS — R278 Other lack of coordination: Secondary | ICD-10-CM

## 2020-02-01 DIAGNOSIS — M6281 Muscle weakness (generalized): Secondary | ICD-10-CM

## 2020-02-01 DIAGNOSIS — R293 Abnormal posture: Secondary | ICD-10-CM

## 2020-02-01 NOTE — Therapy (Signed)
[facility] [address] [place], [place], [address] Phone: [phone]   Fax:  [phone]  Physical Therapy Treatment  Patient Details  Name: Terri Hamilton MRN: [id number] Date of Birth: [birth year] Referring Provider (PT): Gaetano Net   Encounter Date: 02/01/2020  PT End of Session - 02/01/20 1628    Visit Number  3    Number of Visits  12    Date for PT Re-Evaluation  04/08/20    Authorization Type  IE 01/15/2020    PT Start Time  0900    PT Stop Time  0955    PT Time Calculation (min)  55 min    Activity Tolerance  Patient tolerated treatment well    Behavior During Therapy  [facility] for tasks assessed/performed       Past Medical History:  Diagnosis Date  . Asthma   . Back pain   . Back pain   . Hypertension   . Melanoma ([place]) 08/09/2014   Resected from Right side of face.    Past Surgical History:  Procedure Laterality Date  . BREAST BIOPSY Right 10/08/2015   INTRADUCTAL PAPILLOMA   . BREAST BIOPSY Left 10/08/2015   CYST WALL FRAGMENTS AND BLOOD CLOT.   Marland Kitchen BREAST EXCISIONAL BIOPSY Left 1995   neg  . BREAST SURGERY    . CESAREAN SECTION    . COLONOSCOPY WITH PROPOFOL N/A 11/22/2015   Procedure: COLONOSCOPY WITH PROPOFOL;  Surgeon: Lollie Sails, MD;  Location: [facility] ENDOSCOPY;  Service: Endoscopy;  Laterality: N/A;  . FISSURECTOMY    . KNEE SURGERY    . TONSILLECTOMY      There were no vitals filed for this visit.  Subjective Assessment - 02/01/20 0904    Subjective  Patient notes that she focused primarily on breathing this week and added in some DRAM corrective exercises.    Currently in Pain?  No/denies      TREATMENT  Neuromuscular Re-education: Supine hooklying diaphragmatic breathing with VCs and TCs for downregulation of the nervous system and improved management of IAP Supine diastasis closure with coordinated breath for improved IAP management and abdominal coordination Supine hooklying,  TrA activation with exhalation. VCs and TCs to decrease compensatory patterns and minimize aggravation of the lumbar paraspinals. Seated TrA activation with exhalation. VCs and TCs to decrease compensatory patterns for improved postural awareness Standing Pilates serve a tray, YTB, with exhalation. VCs to encourage TrA activation distally to inferiorly Patient education on fitsplint/abdominal brace for increased postural support and decreased DRAM.   Patient educated throughout session on appropriate technique and form using multi-modal cueing, HEP, and activity modification. Patient articulated understanding and returned demonstration.  Patient Response to interventions: Patient comfortable with additions to HEP  ASSESSMENT Patient presents to clinic with excellent motivation to participate in therapy. Patient demonstrates deficits in posture, pain, gait, PFM coordination, PFM extensibility, PFM strength, scar mobility. Patient able to achieve TrA activation with coordinated breath and against light resistance during today's session and responded positively to educational interventions. Patient will benefit from continued skilled therapeutic intervention to address remaining deficits in posture, pain, gait, PFM coordination, PFM extensibility, PFM strength, scar mobility in order to increase function and improve overall QOL.       PT Long Term Goals - 01/15/20 1529      PT LONG TERM GOAL #1   Title  Patient will demonstrate independence with HEP in order to maximize therapeutic gains and improve carryover from physical therapy sessions  to ADLs in the home and community.    Baseline  IE: not demonstrated    Time  12    Period  Weeks    Status  New    Target Date  04/08/20      PT LONG TERM GOAL #2   Title  Patient will return to vigorous physical activities (biking, brisk walking) with minimal urinary/fecal incontinence at frequency of <25% with activity of duration > 60 minutes for  participation in community and wellness activties.    Baseline  IE: 50%+    Time  12    Period  Weeks    Status  New    Target Date  04/08/20      PT LONG TERM GOAL #3   Title  Patient will demonstrate circumferential and sequential contraction of >4/5 MMT, > 6 sec hold x10 and 5 consecutive quick flicks with </= 10 min rest between testing bouts, and relaxation of the PFM coordinated with breath for improved management of intra-abdominal pressure and normal bowel and bladder function without the presence of pain nor incontinence in order to improve participation at home and in the community.    Baseline  IE: not demonstrated    Time  12    Period  Weeks    Status  New    Target Date  04/08/20      PT LONG TERM GOAL #4   Title  Patient will demonstrate coordinated lengthening and relaxation of PFM with diaphragmatic inhalation in order to decrease spasm and allow for unrestricted elimination of urine/feces for improved overall QOL.    Baseline  IE: not demonstrated    Time  12    Period  Weeks    Status  New    Target Date  04/08/20      PT LONG TERM GOAL #5   Title  Patient will demonstrate improved function as evidenced by a score of (Urinary 61; Bowel Leakage 65; Bowel Constipation 64) on FOTO measure for full participation in activities at home and in the community.    Baseline  IE: Urinary 54; Bowel Leakage 57; Bowel Constipation 55    Time  12    Period  Weeks    Status  New    Target Date  04/08/20            Plan - 02/01/20 1629    Clinical Impression Statement  Patient presents to clinic with excellent motivation to participate in therapy. Patient demonstrates deficits in posture, pain, gait, PFM coordination, PFM extensibility, PFM strength, scar mobility. Patient able to achieve TrA activation with coordinated breath and against light resistance during today's session and responded positively to educational interventions. Patient will benefit from continued skilled  therapeutic intervention to address remaining deficits in posture, pain, gait, PFM coordination, PFM extensibility, PFM strength, scar mobility in order to increase function and improve overall QOL.    Personal Factors and Comorbidities  Age;Education;Sex;Comorbidity 3+;Past/Current Experience;Time since onset of injury/illness/exacerbation;Fitness    Comorbidities  asthma, HTN, back pain, depression, hyperlipidemia, anxiety, sleep apnea    Examination-Activity Limitations  Sit;Transfers;Sleep;Bend;Lift;Squat;Locomotion Level;Carry;Continence;Stand;Stairs    Examination-Participation Restrictions  Interpersonal Relationship;Yard Work;Laundry;Cleaning;Community Activity;Shop    Stability/Clinical Decision Making  Evolving/Moderate complexity    Rehab Potential  Fair    PT Frequency  1x / week    PT Duration  12 weeks    PT Treatment/Interventions  ADLs/Self Care Home Management;Biofeedback;Cryotherapy;Moist Heat;Electrical Stimulation;Therapeutic activities;Therapeutic exercise;Balance training;Neuromuscular re-education;Patient/family education;Manual techniques;Scar mobilization;Taping;Dry needling;Passive range of motion;Spinal  Manipulations;Joint Manipulations    PT Next Visit Plan  External PFM assessment; IAP basics    PT Home Exercise Plan  bladder diary; water before coffee    Consulted and Agree with Plan of Care  Patient       Patient will benefit from skilled therapeutic intervention in order to improve the following deficits and impairments:  Abnormal gait, Decreased balance, Decreased endurance, Decreased mobility, Difficulty walking, Postural dysfunction, Improper body mechanics, Decreased strength, Decreased coordination, Decreased activity tolerance, Decreased range of motion, Increased muscle spasms, Impaired tone, Decreased scar mobility, Increased fascial restricitons, Impaired flexibility, Obesity  Visit Diagnosis: Other lack of coordination  Muscle weakness  (generalized)  Abnormal posture     Problem List There are no problems to display for this patient.  Myles Gip PT, DPT [phone] 02/01/2020, 4:33 PM  [facility] [address] [place], [place], [address] Phone: [phone]   Fax:  [phone]  Name: Terri Hamilton MRN: [id number] Date of Birth: 1950-09-27

## 2020-02-08 ENCOUNTER — Other Ambulatory Visit: Payer: Self-pay

## 2020-02-08 ENCOUNTER — Encounter: Payer: Self-pay | Admitting: Physical Therapy

## 2020-02-08 ENCOUNTER — Ambulatory Visit: Payer: Medicare Other | Attending: Nurse Practitioner | Admitting: Physical Therapy

## 2020-02-08 DIAGNOSIS — R278 Other lack of coordination: Secondary | ICD-10-CM

## 2020-02-08 DIAGNOSIS — M6281 Muscle weakness (generalized): Secondary | ICD-10-CM

## 2020-02-08 DIAGNOSIS — R293 Abnormal posture: Secondary | ICD-10-CM

## 2020-02-08 NOTE — Therapy (Signed)
[facility] [address] [place], [place], [address] Phone: [phone]   Fax:  [phone]  Physical Therapy Treatment  Patient Details  Name: Terri Hamilton MRN: [id number] Date of Birth: 06-08-1951 Referring Provider (PT): Gaetano Net   Encounter Date: 02/08/2020  PT End of Session - 02/08/20 1023    Visit Number  4    Number of Visits  12    Date for PT Re-Evaluation  04/08/20    Authorization Type  IE 01/15/2020    PT Start Time  1013    PT Stop Time  1055    PT Time Calculation (min)  42 min    Activity Tolerance  Patient tolerated treatment well    Behavior During Therapy  [facility] for tasks assessed/performed       Past Medical History:  Diagnosis Date  . Asthma   . Back pain   . Back pain   . Hypertension   . Melanoma ([place]) 08/09/2014   Resected from Right side of face.    Past Surgical History:  Procedure Laterality Date  . BREAST BIOPSY Right 10/08/2015   INTRADUCTAL PAPILLOMA   . BREAST BIOPSY Left 10/08/2015   CYST WALL FRAGMENTS AND BLOOD CLOT.   Marland Kitchen BREAST EXCISIONAL BIOPSY Left 1995   neg  . BREAST SURGERY    . CESAREAN SECTION    . COLONOSCOPY WITH PROPOFOL N/A 11/22/2015   Procedure: COLONOSCOPY WITH PROPOFOL;  Surgeon: Lollie Sails, MD;  Location: [facility] ENDOSCOPY;  Service: Endoscopy;  Laterality: N/A;  . FISSURECTOMY    . KNEE SURGERY    . TONSILLECTOMY      There were no vitals filed for this visit.  Subjective Assessment - 02/08/20 1014    Subjective  Patient presents to clinic about 15 minutes late because she lost track of time. Patient notes that she has some soreness primarily on the L side from exercises. Patient has noticed that there are times when she is in motion that she is breath holding when she was unaware. Patient adds that she is having trouble figuring out where to fit her exercises in consistently.    Currently in Pain?  No/denies       TREATMENT  Neuromuscular  Re-education: Sdelying diaphragmatic breathing with VCs and TCs for downregulation of the nervous system and improved management of IAP Sidelying, thoracolumbar rotations (open-book) bilaterally with diaphragmatic breathing for improved diaphragmatic and rib cage excursion. VCs and TCs to prevent compensations. Standing Pilates serve a tray, RTB, with exhalation. VCs to encourage TrA activation distally to inferiorly Standing Pilates hug a tree, RTB, with exhalation. VCs to encourage TrA activation distally to inferiorly Patient discussion on strategies for successfully incorporating home exercises into routine.  Patient educated throughout session on appropriate technique and form using multi-modal cueing, HEP, and activity modification. Patient articulated understanding and returned demonstration.  Patient Response to interventions: Patient reports good stretch from open-book.   ASSESSMENT Patient presents to clinic with excellent motivation to participate in therapy. Patient demonstrates deficits in posture, pain, gait, PFM coordination, PFM extensibility, PFM strength, scar mobility. Patient able to achieve TrA activation with increased repetitions and challenge during today's session and responded positively to educational interventions. Patient will benefit from continued skilled therapeutic intervention to address remaining deficits in posture, pain, gait, PFM coordination, PFM extensibility, PFM strength, scar mobility in order to increase function and improve overall QOL.    PT Long Term Goals - 01/15/20 1529  PT LONG TERM GOAL #1   Title  Patient will demonstrate independence with HEP in order to maximize therapeutic gains and improve carryover from physical therapy sessions to ADLs in the home and community.    Baseline  IE: not demonstrated    Time  12    Period  Weeks    Status  New    Target Date  04/08/20      PT LONG TERM GOAL #2   Title  Patient will return to  vigorous physical activities (biking, brisk walking) with minimal urinary/fecal incontinence at frequency of <25% with activity of duration > 60 minutes for participation in community and wellness activties.    Baseline  IE: 50%+    Time  12    Period  Weeks    Status  New    Target Date  04/08/20      PT LONG TERM GOAL #3   Title  Patient will demonstrate circumferential and sequential contraction of >4/5 MMT, > 6 sec hold x10 and 5 consecutive quick flicks with </= 10 min rest between testing bouts, and relaxation of the PFM coordinated with breath for improved management of intra-abdominal pressure and normal bowel and bladder function without the presence of pain nor incontinence in order to improve participation at home and in the community.    Baseline  IE: not demonstrated    Time  12    Period  Weeks    Status  New    Target Date  04/08/20      PT LONG TERM GOAL #4   Title  Patient will demonstrate coordinated lengthening and relaxation of PFM with diaphragmatic inhalation in order to decrease spasm and allow for unrestricted elimination of urine/feces for improved overall QOL.    Baseline  IE: not demonstrated    Time  12    Period  Weeks    Status  New    Target Date  04/08/20      PT LONG TERM GOAL #5   Title  Patient will demonstrate improved function as evidenced by a score of (Urinary 61; Bowel Leakage 65; Bowel Constipation 64) on FOTO measure for full participation in activities at home and in the community.    Baseline  IE: Urinary 54; Bowel Leakage 57; Bowel Constipation 55    Time  12    Period  Weeks    Status  New    Target Date  04/08/20            Plan - 02/08/20 1023    Clinical Impression Statement  Patient presents to clinic with excellent motivation to participate in therapy. Patient demonstrates deficits in posture, pain, gait, PFM coordination, PFM extensibility, PFM strength, scar mobility. Patient able to achieve TrA activation with increased  repetitions and challenge during today's session and responded positively to educational interventions. Patient will benefit from continued skilled therapeutic intervention to address remaining deficits in posture, pain, gait, PFM coordination, PFM extensibility, PFM strength, scar mobility in order to increase function and improve overall QOL.    Personal Factors and Comorbidities  Age;Education;Sex;Comorbidity 3+;Past/Current Experience;Time since onset of injury/illness/exacerbation;Fitness    Comorbidities  asthma, HTN, back pain, depression, hyperlipidemia, anxiety, sleep apnea    Examination-Activity Limitations  Sit;Transfers;Sleep;Bend;Lift;Squat;Locomotion Level;Carry;Continence;Stand;Stairs    Examination-Participation Restrictions  Interpersonal Relationship;Yard Work;Laundry;Cleaning;Community Activity;Shop    Stability/Clinical Decision Making  Evolving/Moderate complexity    Rehab Potential  Fair    PT Frequency  1x / week    PT Duration  12 weeks    PT Treatment/Interventions  ADLs/Self Care Home Management;Biofeedback;Cryotherapy;Moist Heat;Electrical Stimulation;Therapeutic activities;Therapeutic exercise;Balance training;Neuromuscular re-education;Patient/family education;Manual techniques;Scar mobilization;Taping;Dry needling;Passive range of motion;Spinal Manipulations;Joint Manipulations    PT Next Visit Plan  External PFM assessment; IAP basics    PT Home Exercise Plan  bladder diary; water before coffee    Consulted and Agree with Plan of Care  Patient       Patient will benefit from skilled therapeutic intervention in order to improve the following deficits and impairments:  Abnormal gait, Decreased balance, Decreased endurance, Decreased mobility, Difficulty walking, Postural dysfunction, Improper body mechanics, Decreased strength, Decreased coordination, Decreased activity tolerance, Decreased range of motion, Increased muscle spasms, Impaired tone, Decreased scar mobility,  Increased fascial restricitons, Impaired flexibility, Obesity  Visit Diagnosis: Other lack of coordination  Muscle weakness (generalized)  Abnormal posture     Problem List There are no problems to display for this patient.  Myles Gip PT, DPT [phone] 02/08/2020, 12:38 PM   [facility] [address] [place], [place], [address] Phone: [phone]   Fax:  [phone]  Name: Terri Hamilton MRN: [id number] Date of Birth: 1951/09/05

## 2020-02-20 ENCOUNTER — Encounter: Payer: Medicare Other | Admitting: Physical Therapy

## 2020-03-07 ENCOUNTER — Ambulatory Visit: Payer: Medicare Other | Attending: Nurse Practitioner | Admitting: Physical Therapy

## 2020-03-07 DIAGNOSIS — M6281 Muscle weakness (generalized): Secondary | ICD-10-CM | POA: Insufficient documentation

## 2020-03-07 DIAGNOSIS — R278 Other lack of coordination: Secondary | ICD-10-CM | POA: Insufficient documentation

## 2020-03-07 DIAGNOSIS — R293 Abnormal posture: Secondary | ICD-10-CM | POA: Insufficient documentation

## 2020-03-20 ENCOUNTER — Encounter: Payer: Self-pay | Admitting: Physical Therapy

## 2020-03-20 ENCOUNTER — Ambulatory Visit: Payer: Medicare Other | Admitting: Physical Therapy

## 2020-03-20 ENCOUNTER — Other Ambulatory Visit: Payer: Self-pay

## 2020-03-20 DIAGNOSIS — R278 Other lack of coordination: Secondary | ICD-10-CM

## 2020-03-20 DIAGNOSIS — M6281 Muscle weakness (generalized): Secondary | ICD-10-CM

## 2020-03-20 DIAGNOSIS — R293 Abnormal posture: Secondary | ICD-10-CM | POA: Diagnosis present

## 2020-03-20 NOTE — Therapy (Signed)
[facility] [address] [place], [place], [address] Phone: [phone]   Fax:  [phone]  Physical Therapy Treatment  Patient Details  Name: Terri Hamilton MRN: [id number] Date of Birth: 1950/09/30 Referring Provider (PT): Gaetano Net   Encounter Date: 03/20/2020   PT End of Session - 03/20/20 0958    Visit Number 5    Number of Visits 12    Date for PT Re-Evaluation 04/08/20    Authorization Type IE 01/15/2020    PT Start Time 1000    PT Stop Time 1055    PT Time Calculation (min) 55 min    Activity Tolerance Patient tolerated treatment well    Behavior During Therapy [facility] for tasks assessed/performed           Past Medical History:  Diagnosis Date   Asthma    Back pain    Back pain    Hypertension    Melanoma ([place]) 08/09/2014   Resected from Right side of face.    Past Surgical History:  Procedure Laterality Date   BREAST BIOPSY Right 10/08/2015   INTRADUCTAL PAPILLOMA    BREAST BIOPSY Left 10/08/2015   CYST WALL FRAGMENTS AND BLOOD CLOT.    BREAST EXCISIONAL BIOPSY Left 1995   neg   BREAST SURGERY     CESAREAN SECTION     COLONOSCOPY WITH PROPOFOL N/A 11/22/2015   Procedure: COLONOSCOPY WITH PROPOFOL;  Surgeon: Lollie Sails, MD;  Location: [facility] ENDOSCOPY;  Service: Endoscopy;  Laterality: N/A;   FISSURECTOMY     KNEE SURGERY     TONSILLECTOMY      There were no vitals filed for this visit.   Subjective Assessment - 03/20/20 1001    Subjective Patient notes that she has been feeling overwhelmed as everything opens back up. She notes that she has been successful with bringing mindfulness and awareness to her daily activities. She adds that she has been able to sustain a posture with length in her spine to lift her ribs off her pelvis. Patient has not been formally exercising but is able to layer her core activation into various ADLs with good success.    Currently in Pain? No/denies           TREATMENT  Neuromuscular Re-education: Supine diaphragmatic breathing with VCs and TCs for downregulation of the nervous system and improved management of IAP Supine hooklying, PFM lengthening with inhalation. VCs and TCs to decrease compensatory patterns and encourage optimal relaxation of the PFM. Supine hooklying, PFM contractions (endurance 2 sec x3) with exhalation. VCs and TCs to decrease compensatory patterns and encourage activation of the PFM. Patient education on urge suppression strategies and effective implementation including: heel raises, distraction, sitting, breath work.  Patient education on importance of PFM lengthening as well as strengthening for optimal function.    Patient educated throughout session on appropriate technique and form using multi-modal cueing, HEP, and activity modification. Patient articulated understanding and returned demonstration.  Patient Response to interventions: Patient reports comfort with new HEP and return in 2 weeks.   ASSESSMENT Patient presents to clinic with excellent motivation to participate in therapy. Patient demonstrates deficits in posture, pain, gait, PFM coordination, PFM extensibility, PFM strength, scar mobility. Patient able to coordinate PFM relaxation with increased diaphragmatic breathing during today's session and responded positively to educational interventions. Patient will benefit from continued skilled therapeutic intervention to address remaining deficits in posture, pain, gait, PFM coordination, PFM extensibility, PFM strength, scar mobility in  order to increase function and improve overall QOL.      PT Long Term Goals - 03/20/20 1009      PT LONG TERM GOAL #1   Title Patient will demonstrate independence with HEP in order to maximize therapeutic gains and improve carryover from physical therapy sessions to ADLs in the home and community.    Baseline IE: not demonstrated; 7/13: IND    Time 12     Period Weeks    Status Achieved    Target Date 04/08/20      PT LONG TERM GOAL #2   Title Patient will return to vigorous physical activities (biking, brisk walking) with minimal urinary/fecal incontinence at frequency of <25% with activity of duration > 60 minutes for participation in community and wellness activties.    Baseline IE: 50%+; 7/13: 35%    Time 12    Period Weeks    Status On-going    Target Date 04/08/20      PT LONG TERM GOAL #3   Title Patient will demonstrate circumferential and sequential contraction of >4/5 MMT, > 6 sec hold x10 and 5 consecutive quick flicks with </= 10 min rest between testing bouts, and relaxation of the PFM coordinated with breath for improved management of intra-abdominal pressure and normal bowel and bladder function without the presence of pain nor incontinence in order to improve participation at home and in the community.    Baseline IE: not demonstrated; 7/13: 3/5 MMT, 2 sec hold x3    Time 12    Period Weeks    Status On-going    Target Date 04/08/20      PT LONG TERM GOAL #4   Title Patient will demonstrate coordinated lengthening and relaxation of PFM with diaphragmatic inhalation in order to decrease spasm and allow for unrestricted elimination of urine/feces for improved overall QOL.    Baseline IE: not demonstrated; 7/13: requires increased time and breath to coordinate consistently    Time 12    Period Weeks    Status On-going    Target Date 04/08/20      PT LONG TERM GOAL #5   Title Patient will demonstrate improved function as evidenced by a score of (Urinary 61; Bowel Leakage 65; Bowel Constipation 64) on FOTO measure for full participation in activities at home and in the community.    Baseline IE: Urinary 54; Bowel Leakage 57; Bowel Constipation 55; 7/13: Urinary 52; Bowel Leakage 60; Bowel Constipation 68)    Time 12    Period Weeks    Status On-going    Target Date 04/08/20                 Plan - 03/20/20 0958     Clinical Impression Statement Patient presents to clinic with excellent motivation to participate in therapy. Patient demonstrates deficits in posture, pain, gait, PFM coordination, PFM extensibility, PFM strength, scar mobility. Patient able to coordinate PFM relaxation with increased diaphragmatic breathing during today's session and responded positively to educational interventions. Patient will benefit from continued skilled therapeutic intervention to address remaining deficits in posture, pain, gait, PFM coordination, PFM extensibility, PFM strength, scar mobility in order to increase function and improve overall QOL.    Personal Factors and Comorbidities Age;Education;Sex;Comorbidity 3+;Past/Current Experience;Time since onset of injury/illness/exacerbation;Fitness    Comorbidities asthma, HTN, back pain, depression, hyperlipidemia, anxiety, sleep apnea    Examination-Activity Limitations Sit;Transfers;Sleep;Bend;Lift;Squat;Locomotion Level;Carry;Continence;Stand;Stairs    Examination-Participation Restrictions Interpersonal Relationship;Yard Work;Laundry;Cleaning;Community Activity;Shop    Stability/Clinical Decision Making Evolving/Moderate  complexity    Rehab Potential Fair    PT Frequency 1x / week    PT Duration 12 weeks    PT Treatment/Interventions ADLs/Self Care Home Management;Biofeedback;Cryotherapy;Moist Heat;Electrical Stimulation;Therapeutic activities;Therapeutic exercise;Balance training;Neuromuscular re-education;Patient/family education;Manual techniques;Scar mobilization;Taping;Dry needling;Passive range of motion;Spinal Manipulations;Joint Manipulations    PT Next Visit Plan External PFM assessment; IAP basics    PT Home Exercise Plan bladder diary; water before coffee    Consulted and Agree with Plan of Care Patient           Patient will benefit from skilled therapeutic intervention in order to improve the following deficits and impairments:  Abnormal gait, Decreased  balance, Decreased endurance, Decreased mobility, Difficulty walking, Postural dysfunction, Improper body mechanics, Decreased strength, Decreased coordination, Decreased activity tolerance, Decreased range of motion, Increased muscle spasms, Impaired tone, Decreased scar mobility, Increased fascial restricitons, Impaired flexibility, Obesity  Visit Diagnosis: Other lack of coordination  Muscle weakness (generalized)  Abnormal posture     Problem List There are no problems to display for this patient.  Myles Gip PT, DPT [phone] 03/20/2020, 12:43 PM  [place] [facility] [address] [place], [place], [address] Phone: [phone]   Fax:  [phone]  Name: Amarya Kuehl MRN: [id number] Date of Birth: Jun 09, 1951

## 2020-04-03 ENCOUNTER — Other Ambulatory Visit: Payer: Self-pay

## 2020-04-03 ENCOUNTER — Encounter: Payer: Self-pay | Admitting: Physical Therapy

## 2020-04-03 ENCOUNTER — Ambulatory Visit: Payer: Medicare Other | Admitting: Physical Therapy

## 2020-04-03 DIAGNOSIS — M6281 Muscle weakness (generalized): Secondary | ICD-10-CM

## 2020-04-03 DIAGNOSIS — R293 Abnormal posture: Secondary | ICD-10-CM

## 2020-04-03 DIAGNOSIS — R278 Other lack of coordination: Secondary | ICD-10-CM

## 2020-04-03 NOTE — Therapy (Signed)
[facility] [address] [place], [place], [address] Phone: [phone]   Fax:  [phone]  Physical Therapy Treatment  Patient Details  Name: Terri Hamilton MRN: [id number] Date of Birth: 03-Nov-1950 Referring Provider (PT): Gaetano Net   Encounter Date: 04/03/2020   PT End of Session - 04/03/20 0906    Visit Number 6    Number of Visits 12    Date for PT Re-Evaluation 04/08/20    Authorization Type IE 01/15/2020    PT Start Time 0900    PT Stop Time 0955    PT Time Calculation (min) 55 min    Activity Tolerance Patient tolerated treatment well    Behavior During Therapy [facility] for tasks assessed/performed           Past Medical History:  Diagnosis Date   Asthma    Back pain    Back pain    Hypertension    Melanoma ([place]) 08/09/2014   Resected from Right side of face.    Past Surgical History:  Procedure Laterality Date   BREAST BIOPSY Right 10/08/2015   INTRADUCTAL PAPILLOMA    BREAST BIOPSY Left 10/08/2015   CYST WALL FRAGMENTS AND BLOOD CLOT.    BREAST EXCISIONAL BIOPSY Left 1995   neg   BREAST SURGERY     CESAREAN SECTION     COLONOSCOPY WITH PROPOFOL N/A 11/22/2015   Procedure: COLONOSCOPY WITH PROPOFOL;  Surgeon: Lollie Sails, MD;  Location: [facility] ENDOSCOPY;  Service: Endoscopy;  Laterality: N/A;   FISSURECTOMY     KNEE SURGERY     TONSILLECTOMY      There were no vitals filed for this visit.   Subjective Assessment - 04/03/20 0902    Subjective Patient notes that she is doing well; despite not being as consistent with HEP she is still seeing results. Patient adds that her biggest success was being able to suppress urinary urge while filling her cat's water bowl. Patient continues to add awareness to her ADLs and feels lifting form the TrA superior to the pubic bone.    Currently in Pain? No/denies             TREATMENT  Manual Therapy: STM and TPR performed to B lumbar  paraspinals and QL to allow for decreased tension and pain and improved posture and function with vibratory and percussive instrument L thoracic UPA mobilizations with movement (open book) for decreased spasm and improved mobility, grade II/III  Neuromuscular Re-education: Supine diaphragmatic breathing with VCs and TCs for downregulation of the nervous system and improved management of IAP Sidelying, thoracolumbar rotations (open-book) bilaterally with diaphragmatic breathing for improved diaphragmatic and rib cage excursion. VCs and TCs to prevent compensations. Supine hooklying, TrA activation with exhalation with DRAM closure tactile cue/assist. VCs and TCs to decrease compensatory patterns and minimize aggravation of the lumbar paraspinals. Reassessed DRAM; patient has 3 finger width at and superior to umbilicus, but laxity of linea alba is greatly reduced with good tension throughout linea alba.    Patient educated throughout session on appropriate technique and form using multi-modal cueing, HEP, and activity modification. Patient articulated understanding and returned demonstration.  Patient Response to interventions: Patient reports comfort with addition of HEP and return in 2 weeks.   ASSESSMENT Patient presents to clinic with excellent motivation to participate in therapy. Patient demonstrates deficits in posture, pain, gait, PFM coordination, PFM extensibility, PFM strength, scar mobility. Patient continues to progress toward goals and apply principles of IAP  management and abdominal rehabilitation well and appropriately during today's session and responded positively to educational interventions. Patient will benefit from continued skilled therapeutic intervention to address remaining deficits in posture, pain, gait, PFM coordination, PFM extensibility, PFM strength, scar mobility in order to increase function and improve overall QOL.      PT Long Term Goals - 03/20/20 1009       PT LONG TERM GOAL #1   Title Patient will demonstrate independence with HEP in order to maximize therapeutic gains and improve carryover from physical therapy sessions to ADLs in the home and community.    Baseline IE: not demonstrated; 7/13: IND    Time 12    Period Weeks    Status Achieved    Target Date 04/08/20      PT LONG TERM GOAL #2   Title Patient will return to vigorous physical activities (biking, brisk walking) with minimal urinary/fecal incontinence at frequency of <25% with activity of duration > 60 minutes for participation in community and wellness activties.    Baseline IE: 50%+; 7/13: 35%    Time 12    Period Weeks    Status On-going    Target Date 04/08/20      PT LONG TERM GOAL #3   Title Patient will demonstrate circumferential and sequential contraction of >4/5 MMT, > 6 sec hold x10 and 5 consecutive quick flicks with </= 10 min rest between testing bouts, and relaxation of the PFM coordinated with breath for improved management of intra-abdominal pressure and normal bowel and bladder function without the presence of pain nor incontinence in order to improve participation at home and in the community.    Baseline IE: not demonstrated; 7/13: 3/5 MMT, 2 sec hold x3    Time 12    Period Weeks    Status On-going    Target Date 04/08/20      PT LONG TERM GOAL #4   Title Patient will demonstrate coordinated lengthening and relaxation of PFM with diaphragmatic inhalation in order to decrease spasm and allow for unrestricted elimination of urine/feces for improved overall QOL.    Baseline IE: not demonstrated; 7/13: requires increased time and breath to coordinate consistently    Time 12    Period Weeks    Status On-going    Target Date 04/08/20      PT LONG TERM GOAL #5   Title Patient will demonstrate improved function as evidenced by a score of (Urinary 61; Bowel Leakage 65; Bowel Constipation 64) on FOTO measure for full participation in activities at home and in  the community.    Baseline IE: Urinary 54; Bowel Leakage 57; Bowel Constipation 55; 7/13: Urinary 52; Bowel Leakage 60; Bowel Constipation 68)    Time 12    Period Weeks    Status On-going    Target Date 04/08/20                 Plan - 04/03/20 0907    Clinical Impression Statement Patient presents to clinic with excellent motivation to participate in therapy. Patient demonstrates deficits in posture, pain, gait, PFM coordination, PFM extensibility, PFM strength, scar mobility. Patient continues to progress toward goals and apply principles of IAP management and abdominal rehabilitation well and appropriately during today's session and responded positively to educational interventions. Patient will benefit from continued skilled therapeutic intervention to address remaining deficits in posture, pain, gait, PFM coordination, PFM extensibility, PFM strength, scar mobility in order to increase function and improve overall QOL.  Personal Factors and Comorbidities Age;Education;Sex;Comorbidity 3+;Past/Current Experience;Time since onset of injury/illness/exacerbation;Fitness    Comorbidities asthma, HTN, back pain, depression, hyperlipidemia, anxiety, sleep apnea    Examination-Activity Limitations Sit;Transfers;Sleep;Bend;Lift;Squat;Locomotion Level;Carry;Continence;Stand;Stairs    Examination-Participation Restrictions Interpersonal Relationship;Yard Work;Laundry;Cleaning;Community Activity;Shop    Stability/Clinical Decision Making Evolving/Moderate complexity    Rehab Potential Fair    PT Frequency 1x / week    PT Duration 12 weeks    PT Treatment/Interventions ADLs/Self Care Home Management;Biofeedback;Cryotherapy;Moist Heat;Electrical Stimulation;Therapeutic activities;Therapeutic exercise;Balance training;Neuromuscular re-education;Patient/family education;Manual techniques;Scar mobilization;Taping;Dry needling;Passive range of motion;Spinal Manipulations;Joint Manipulations    PT Next  Visit Plan External PFM assessment; IAP basics    PT Home Exercise Plan bladder diary; water before coffee    Consulted and Agree with Plan of Care Patient           Patient will benefit from skilled therapeutic intervention in order to improve the following deficits and impairments:  Abnormal gait, Decreased balance, Decreased endurance, Decreased mobility, Difficulty walking, Postural dysfunction, Improper body mechanics, Decreased strength, Decreased coordination, Decreased activity tolerance, Decreased range of motion, Increased muscle spasms, Impaired tone, Decreased scar mobility, Increased fascial restricitons, Impaired flexibility, Obesity  Visit Diagnosis: Other lack of coordination  Muscle weakness (generalized)  Abnormal posture     Problem List There are no problems to display for this patient.  Myles Gip PT, DPT [phone] 04/03/2020, 10:33 AM  [facility] [address] [place], [place], [address] Phone: [phone]   Fax:  [phone]  Name: Terri Hamilton MRN: [id number] Date of Birth: 1951/01/14

## 2020-04-17 ENCOUNTER — Other Ambulatory Visit: Payer: Self-pay

## 2020-04-17 ENCOUNTER — Ambulatory Visit: Payer: Medicare Other | Attending: Nurse Practitioner | Admitting: Physical Therapy

## 2020-04-17 ENCOUNTER — Encounter: Payer: Self-pay | Admitting: Physical Therapy

## 2020-04-17 DIAGNOSIS — R293 Abnormal posture: Secondary | ICD-10-CM | POA: Insufficient documentation

## 2020-04-17 DIAGNOSIS — M6281 Muscle weakness (generalized): Secondary | ICD-10-CM | POA: Diagnosis present

## 2020-04-17 DIAGNOSIS — R278 Other lack of coordination: Secondary | ICD-10-CM

## 2020-04-17 NOTE — Therapy (Signed)
[facility] [address] [place], [place], [address] Phone: [phone]   Fax:  [phone]  Physical Therapy Treatment  Patient Details  Name: Terri Hamilton MRN: [id number] Date of Birth: October 22, 1950 Referring Provider (PT): Gaetano Net   Encounter Date: 04/17/2020   PT End of Session - 04/17/20 0959    Visit Number 7    Number of Visits 24    Date for PT Re-Evaluation 07/10/20    Authorization Type IE 01/15/2020    PT Start Time 1000    PT Stop Time 1055    PT Time Calculation (min) 55 min    Activity Tolerance Patient tolerated treatment well    Behavior During Therapy [facility] for tasks assessed/performed           Past Medical History:  Diagnosis Date  . Asthma   . Back pain   . Back pain   . Hypertension   . Melanoma ([place]) 08/09/2014   Resected from Right side of face.    Past Surgical History:  Procedure Laterality Date  . BREAST BIOPSY Right 10/08/2015   INTRADUCTAL PAPILLOMA   . BREAST BIOPSY Left 10/08/2015   CYST WALL FRAGMENTS AND BLOOD CLOT.   Marland Kitchen BREAST EXCISIONAL BIOPSY Left 1995   neg  . BREAST SURGERY    . CESAREAN SECTION    . COLONOSCOPY WITH PROPOFOL N/A 11/22/2015   Procedure: COLONOSCOPY WITH PROPOFOL;  Surgeon: Lollie Sails, MD;  Location: [facility] ENDOSCOPY;  Service: Endoscopy;  Laterality: N/A;  . FISSURECTOMY    . KNEE SURGERY    . TONSILLECTOMY      There were no vitals filed for this visit.   Subjective Assessment - 04/17/20 1004    Subjective Patient notes that she had a nice trip with her grandson. Patient notes that she had diarrhea/stomach upset, but notes that she did not have any issues with control.    Currently in Pain? Yes    Pain Score 1     Pain Location Back    Pain Orientation Lower;Mid    Pain Descriptors / Indicators Sore           TREATMENT  Neuromuscular Re-education: Supine diaphragmatic breathing with VCs and TCs for downregulation of the nervous system  and improved management of IAP Supine hooklying, TrA activation with exhalation with DRAM closure tactile cue/assist. VCs and TCs to decrease compensatory patterns and minimize aggravation of the lumbar paraspinals. Supine hooklying, PFM lengthening with inhalation. VCs and TCs to decrease compensatory patterns and encourage optimal relaxation of the PFM. Supine hooklying, PFM contractions (endurance 2 sec sec x3) with exhalation. VCs and TCs to decrease compensatory patterns and encourage activation of the PFM. Reassessed goals; see below.  Patient educated throughout session on appropriate technique and form using multi-modal cueing, HEP, and activity modification. Patient articulated understanding and returned demonstration.  Patient Response to interventions: Patient reports confidence in PFM strengthening HEP.  ASSESSMENT Patient presents to clinic with excellent motivation to participate in therapy. Patient demonstrates deficits in posture, pain, gait, PFM coordination, PFM extensibility, PFM strength, scar mobility. Patient has exceeded goals related to bowel constipation and bowel leakage as measured by FOTO outcome measures. Patient demonstrating improved PFM coordination during today's session and responded positively to active interventions. Patient continues to have limitations with urinary control and has not been able ot return to previous level of physical activity yet. Patient's condition has the potential to improve in response to therapy. Maximum improvement is  yet to be obtained. The anticipated improvement is attainable and reasonable in a generally predictable time. Patient will benefit from continued skilled therapeutic intervention to address remaining deficits in posture, pain, gait, PFM coordination, PFM extensibility, PFM strength, scar mobility in order to increase function and improve overall QOL.        PT Long Term Goals - 04/17/20 1010      PT LONG TERM GOAL #1    Title Patient will demonstrate independence with HEP in order to maximize therapeutic gains and improve carryover from physical therapy sessions to ADLs in the home and community.    Baseline IE: not demonstrated; 7/13: IND    Time 12    Period Weeks    Status Achieved      PT LONG TERM GOAL #2   Title Patient will return to vigorous physical activities (biking, brisk walking) with minimal urinary/fecal incontinence at frequency of <25% with activity of duration > 60 minutes for participation in community and wellness activties.    Baseline IE: 50%+; 7/13: 35%; 8/11: 35%    Time 12    Period Weeks    Status On-going    Target Date 07/10/20      PT LONG TERM GOAL #3   Title Patient will demonstrate circumferential and sequential contraction of >4/5 MMT, > 6 sec hold x10 and 5 consecutive quick flicks with </= 10 min rest between testing bouts, and relaxation of the PFM coordinated with breath for improved management of intra-abdominal pressure and normal bowel and bladder function without the presence of pain nor incontinence in order to improve participation at home and in the community.    Baseline IE: not demonstrated; 7/13: 3/5 MMT, 2 sec hold x3; 8/11: 3/5 MMT, 2 sec hold x3    Time 12    Period Weeks    Status On-going    Target Date 07/10/20      PT LONG TERM GOAL #4   Title Patient will demonstrate coordinated lengthening and relaxation of PFM with diaphragmatic inhalation in order to decrease spasm and allow for unrestricted elimination of urine/feces for improved overall QOL.    Baseline IE: not demonstrated; 7/13: requires increased time and breath to coordinate consistently; 8/11: coordinated with breath for 5 consecutive reps    Time 12    Period Weeks    Status Achieved    Target Date 07/10/20      PT LONG TERM GOAL #5   Title Patient will demonstrate improved function as evidenced by a score of (Urinary 61; Bowel Leakage 65; Bowel Constipation 64) on FOTO measure for full  participation in activities at home and in the community.    Baseline IE: Urinary 54; Bowel Leakage 57; Bowel Constipation 55; 7/13: Urinary 52; Bowel Leakage 60; Bowel Constipation 68); 8/11: Urinary 57; Bowel Leakage 68; Bowel Constipation 68    Time 12    Period Weeks    Status Partially Met    Target Date 07/10/20                 Plan - 04/17/20 0959    Clinical Impression Statement Patient presents to clinic with excellent motivation to participate in therapy. Patient demonstrates deficits in posture, pain, gait, PFM coordination, PFM extensibility, PFM strength, scar mobility. Patient has exceeded goals related to bowel constipation and bowel leakage as measured by FOTO outcome measures. Patient demonstrating improved PFM coordination during today's session and responded positively to active interventions. Patient continues to have limitations with urinary  control and has not been able ot return to previous level of physical activity yet. Patient's condition has the potential to improve in response to therapy. Maximum improvement is yet to be obtained. The anticipated improvement is attainable and reasonable in a generally predictable time. Patient will benefit from continued skilled therapeutic intervention to address remaining deficits in posture, pain, gait, PFM coordination, PFM extensibility, PFM strength, scar mobility in order to increase function and improve overall QOL.    Personal Factors and Comorbidities Age;Education;Sex;Comorbidity 3+;Past/Current Experience;Time since onset of injury/illness/exacerbation;Fitness    Comorbidities asthma, HTN, back pain, depression, hyperlipidemia, anxiety, sleep apnea    Examination-Activity Limitations Sit;Transfers;Sleep;Bend;Lift;Squat;Locomotion Level;Carry;Continence;Stand;Stairs    Examination-Participation Restrictions Interpersonal Relationship;Yard Work;Laundry;Cleaning;Community Activity;Shop    Stability/Clinical Decision Making  Evolving/Moderate complexity    Rehab Potential Fair    PT Frequency 1x / week    PT Duration 12 weeks    PT Treatment/Interventions ADLs/Self Care Home Management;Biofeedback;Cryotherapy;Moist Heat;Electrical Stimulation;Therapeutic activities;Therapeutic exercise;Balance training;Neuromuscular re-education;Patient/family education;Manual techniques;Scar mobilization;Taping;Dry needling;Passive range of motion;Spinal Manipulations;Joint Manipulations    PT Next Visit Plan External PFM assessment; IAP basics    PT Home Exercise Plan bladder diary; water before coffee    Consulted and Agree with Plan of Care Patient           Patient will benefit from skilled therapeutic intervention in order to improve the following deficits and impairments:  Abnormal gait, Decreased balance, Decreased endurance, Decreased mobility, Difficulty walking, Postural dysfunction, Improper body mechanics, Decreased strength, Decreased coordination, Decreased activity tolerance, Decreased range of motion, Increased muscle spasms, Impaired tone, Decreased scar mobility, Increased fascial restricitons, Impaired flexibility, Obesity  Visit Diagnosis: Other lack of coordination  Muscle weakness (generalized)  Abnormal posture     Problem List There are no problems to display for this patient.  Myles Gip PT, DPT [id number] 04/17/2020, 12:53 PM  [facility] [address] [place], [place], [address] Phone: [phone]   Fax:  [phone]  Name: Terri Hamilton MRN: [id number] Date of Birth: Mar 20, 1951

## 2020-05-08 ENCOUNTER — Encounter: Payer: Medicare Other | Admitting: Physical Therapy

## 2020-10-14 ENCOUNTER — Other Ambulatory Visit: Payer: Self-pay | Admitting: Nurse Practitioner

## 2020-10-14 DIAGNOSIS — Z1231 Encounter for screening mammogram for malignant neoplasm of breast: Secondary | ICD-10-CM

## 2020-10-31 ENCOUNTER — Other Ambulatory Visit: Payer: Self-pay

## 2020-10-31 ENCOUNTER — Ambulatory Visit
Admission: RE | Admit: 2020-10-31 | Discharge: 2020-10-31 | Disposition: A | Discharge status: Home / Self Care | Payer: Medicare Other | Source: Ambulatory Visit | Attending: Nurse Practitioner | Admitting: Nurse Practitioner

## 2020-10-31 DIAGNOSIS — Z1231 Encounter for screening mammogram for malignant neoplasm of breast: Secondary | ICD-10-CM | POA: Diagnosis not present

## 2020-11-07 ENCOUNTER — Other Ambulatory Visit: Payer: Self-pay | Admitting: Nurse Practitioner

## 2020-11-07 DIAGNOSIS — N6310 Unspecified lump in the right breast, unspecified quadrant: Secondary | ICD-10-CM

## 2020-11-07 DIAGNOSIS — R928 Other abnormal and inconclusive findings on diagnostic imaging of breast: Secondary | ICD-10-CM

## 2020-11-12 ENCOUNTER — Ambulatory Visit
Admission: RE | Admit: 2020-11-12 | Discharge: 2020-11-12 | Disposition: A | Discharge status: Home / Self Care | Payer: Medicare Other | Source: Ambulatory Visit | Attending: Nurse Practitioner | Admitting: Nurse Practitioner

## 2020-11-12 ENCOUNTER — Other Ambulatory Visit: Payer: Self-pay

## 2020-11-12 DIAGNOSIS — N6310 Unspecified lump in the right breast, unspecified quadrant: Secondary | ICD-10-CM | POA: Insufficient documentation

## 2020-11-12 DIAGNOSIS — R928 Other abnormal and inconclusive findings on diagnostic imaging of breast: Secondary | ICD-10-CM | POA: Insufficient documentation

## 2020-11-18 ENCOUNTER — Other Ambulatory Visit: Payer: Self-pay | Admitting: Nurse Practitioner

## 2020-11-18 DIAGNOSIS — R928 Other abnormal and inconclusive findings on diagnostic imaging of breast: Secondary | ICD-10-CM

## 2020-11-18 DIAGNOSIS — N6310 Unspecified lump in the right breast, unspecified quadrant: Secondary | ICD-10-CM

## 2020-11-26 ENCOUNTER — Ambulatory Visit
Admission: RE | Admit: 2020-11-26 | Discharge: 2020-11-26 | Disposition: A | Discharge status: Home / Self Care | Payer: Medicare Other | Source: Ambulatory Visit | Attending: Nurse Practitioner | Admitting: Nurse Practitioner

## 2020-11-26 ENCOUNTER — Other Ambulatory Visit: Payer: Self-pay

## 2020-11-26 DIAGNOSIS — R928 Other abnormal and inconclusive findings on diagnostic imaging of breast: Secondary | ICD-10-CM

## 2020-11-26 DIAGNOSIS — N6310 Unspecified lump in the right breast, unspecified quadrant: Secondary | ICD-10-CM

## 2020-11-26 DIAGNOSIS — N6001 Solitary cyst of right breast: Secondary | ICD-10-CM | POA: Diagnosis not present

## 2020-11-26 DIAGNOSIS — M19012 Primary osteoarthritis, left shoulder: Secondary | ICD-10-CM | POA: Insufficient documentation

## 2020-11-26 HISTORY — PX: BREAST CYST ASPIRATION: SHX578

## 2021-10-27 ENCOUNTER — Other Ambulatory Visit: Payer: Self-pay | Admitting: Nurse Practitioner

## 2021-10-27 DIAGNOSIS — Z1231 Encounter for screening mammogram for malignant neoplasm of breast: Secondary | ICD-10-CM

## 2021-12-11 ENCOUNTER — Ambulatory Visit
Admission: RE | Admit: 2021-12-11 | Discharge: 2021-12-11 | Disposition: A | Discharge status: Home / Self Care | Payer: Medicare Other | Source: Ambulatory Visit | Attending: Nurse Practitioner | Admitting: Nurse Practitioner

## 2021-12-11 DIAGNOSIS — Z1231 Encounter for screening mammogram for malignant neoplasm of breast: Secondary | ICD-10-CM | POA: Diagnosis present

## 2021-12-26 ENCOUNTER — Ambulatory Visit
Admission: RE | Admit: 2021-12-26 | Discharge: 2021-12-26 | Disposition: A | Discharge status: Home / Self Care | Payer: Medicare Other | Source: Ambulatory Visit | Attending: Internal Medicine | Admitting: Internal Medicine

## 2021-12-26 ENCOUNTER — Ambulatory Visit (INDEPENDENT_AMBULATORY_CARE_PROVIDER_SITE_OTHER): Payer: Medicare Other

## 2021-12-26 VITALS — BP 132/71 | HR 85 | Temp 98.2°F | Resp 20 | Ht 63.0 in | Wt 195.0 lb

## 2021-12-26 DIAGNOSIS — G4730 Sleep apnea, unspecified: Secondary | ICD-10-CM | POA: Insufficient documentation

## 2021-12-26 DIAGNOSIS — M1990 Unspecified osteoarthritis, unspecified site: Secondary | ICD-10-CM | POA: Insufficient documentation

## 2021-12-26 DIAGNOSIS — E669 Obesity, unspecified: Secondary | ICD-10-CM | POA: Insufficient documentation

## 2021-12-26 DIAGNOSIS — J309 Allergic rhinitis, unspecified: Secondary | ICD-10-CM | POA: Insufficient documentation

## 2021-12-26 DIAGNOSIS — S92422A Displaced fracture of distal phalanx of left great toe, initial encounter for closed fracture: Secondary | ICD-10-CM

## 2021-12-26 DIAGNOSIS — R7302 Impaired glucose tolerance (oral): Secondary | ICD-10-CM | POA: Insufficient documentation

## 2021-12-26 DIAGNOSIS — F419 Anxiety disorder, unspecified: Secondary | ICD-10-CM | POA: Insufficient documentation

## 2021-12-26 DIAGNOSIS — J45909 Unspecified asthma, uncomplicated: Secondary | ICD-10-CM | POA: Insufficient documentation

## 2021-12-26 DIAGNOSIS — E785 Hyperlipidemia, unspecified: Secondary | ICD-10-CM | POA: Insufficient documentation

## 2021-12-26 MED ORDER — IBUPROFEN 600 MG PO TABS: 600.0000 mg | ORAL_TABLET | Freq: Three times a day (TID) | ORAL | 0 refills | Status: AC | PRN

## 2021-12-26 NOTE — ED Triage Notes (Signed)
Patient is here for "Foot Pain/Injury". "Washing dishes, went to pickup pasta bowl, dropped on great toe, left foot". Now swelling, pain, redness. DOI: [id number]. Time: 10 am.  ?

## 2021-12-26 NOTE — Discharge Instructions (Addendum)
Rest and elevate the affected painful area.   ?Apply cold compresses intermittently as needed.   ?As pain recedes, begin normal activities slowly as tolerated.   ?Please use topical antibiotic medication for abrasion of the left great toe. ?Call if symptoms persist.  ?

## 2021-12-26 NOTE — ED Provider Notes (Signed)
?[facility] ? ? ? ?CSN: [id number] ?Arrival date & time: 12/26/21  1444 ? ? ?  ? ?History   ?Chief Complaint ?Chief Complaint  ?Patient presents with  ? Foot Injury  ?  Dropped a bowl on my toe. Bloody, swelling not able to walk on the ball of my foot. - Entered by patient  ? ? ?HPI ?Terri Hamilton is a 71 y.o. female comes to the urgent care with acute onset severe pain of the left great toe.  Pain started when a ceramic bowl fell on the patient's great toe whilst doing dishwashing.  Pain is severe, aggravated by putting weight on the left foot and denies any relieving factors.  Has applied ice to the left great toe with no improvement in pain.  Pain is currently 10 out of 10.  No radiation of pain.  Pain is throbbing in nature and sharp as well.  ? ?HPI ? ?Past Medical History:  ?Diagnosis Date  ? Asthma   ? Back pain   ? Back pain   ? Hypertension   ? Melanoma ([place]) 08/09/2014  ? Resected from Right side of face.  ? ? ?Patient Active Problem List  ? Diagnosis Date Noted  ? Sleep apnea 12/26/2021  ? Obesity (BMI 30.0-34.9) 12/26/2021  ? Impaired glucose tolerance 12/26/2021  ? Hyperlipidemia, mild 12/26/2021  ? Asthma without status asthmaticus 12/26/2021  ? Arthritis 12/26/2021  ? Anxiety 12/26/2021  ? Allergic rhinitis 12/26/2021  ? Arthritis of left glenohumeral joint 11/26/2020  ? Hypertension 09/17/2015  ? Depression, unspecified 09/17/2015  ? Melanoma in situ ([place]) 08/09/2014  ? ? ?Past Surgical History:  ?Procedure Laterality Date  ? BREAST BIOPSY Right 10/08/2015  ? INTRADUCTAL PAPILLOMA   ? BREAST BIOPSY Left 10/08/2015  ? CYST WALL FRAGMENTS AND BLOOD CLOT.   ? BREAST CYST ASPIRATION Right 11/26/2020  ? 7 mm benign cyst in the 2 o'clock  ? BREAST EXCISIONAL BIOPSY Left 1995  ? neg  ? BREAST SURGERY    ? CESAREAN SECTION    ? COLONOSCOPY WITH PROPOFOL N/A 11/22/2015  ? Procedure: COLONOSCOPY WITH PROPOFOL;  Surgeon: Lollie Sails, MD;  Location: [facility] ENDOSCOPY;  Service: Endoscopy;   Laterality: N/A;  ? FISSURECTOMY    ? KNEE SURGERY    ? TONSILLECTOMY    ? ? ?OB History   ? ? Gravida  ?1  ? Para  ?1  ? Term  ?   ? Preterm  ?   ? AB  ?   ? Living  ?   ?  ? ? SAB  ?   ? IAB  ?   ? Ectopic  ?   ? Multiple  ?   ? Live Births  ?   ?   ?  ?  ? ? ? ?Home Medications   ? ?Prior to Admission medications   ?Medication Sig Start Date End Date Taking? Authorizing Provider  ?albuterol (VENTOLIN HFA) 108 (90 Base) MCG/ACT inhaler INHALE 2 INHALATIONS INTO THE LUNGS EVERY 4 HOURS AS NEEDED FOR WHEEZING OR SHORTNESS OF BREATH 12/04/20  Yes [provider]  ?amLODipine (NORVASC) 10 MG tablet TAKE 1 TABLET(10 MG) BY MOUTH EVERY DAY 07/24/21  Yes [provider]  ?busPIRone (BUSPAR) 10 MG tablet Take 10 mg by mouth 3 (three) times daily. 12/17/21  Yes [provider]  ?dicyclomine (BENTYL) 10 MG capsule Take by mouth. 04/23/21  Yes [provider]  ?escitalopram (LEXAPRO) 20 MG tablet Take 30 mg  by mouth daily.    Yes [provider]  ?fluticasone (FLONASE) 50 MCG/ACT nasal spray Place into both nostrils daily.   Yes [provider]  ?HYDROcodone-acetaminophen (NORCO/VICODIN) 5-325 MG tablet Take by mouth. 08/09/14  Yes [provider]  ?hydrOXYzine (ATARAX) 25 MG tablet Take by mouth. 10/26/21  Yes [provider]  ?hyoscyamine (LEVSIN) 0.125 MG tablet Take by mouth. 04/23/21  Yes [provider]  ?ibuprofen (ADVIL) 600 MG tablet Take 1 tablet (600 mg total) by mouth every 8 (eight) hours as needed for moderate pain. 12/26/21  Yes Chalon Zobrist, Myrene Galas, MD  ?lisinopril (PRINIVIL,ZESTRIL) 30 MG tablet Take 30 mg by mouth daily.   Yes [provider]  ?melatonin 3 MG TABS tablet Take by mouth. 09/25/15  Yes [provider]  ?montelukast (SINGULAIR) 10 MG tablet Take 10 mg by mouth daily. 11/16/21  Yes [provider]  ?rosuvastatin (CRESTOR) 5 MG tablet Take 5 mg by mouth daily. 10/16/21  Yes [provider]   ?tiZANidine (ZANAFLEX) 2 MG tablet TAKE 1 TABLET(2 MG) BY MOUTH THREE TIMES DAILY AS NEEDED 01/17/21  Yes [provider]  ?traZODone (DESYREL) 100 MG tablet Take 100 mg by mouth at bedtime. 08/19/21  Yes [provider]  ?triamcinolone (KENALOG) 0.025 % cream Apply topically. 10/18/19  Yes [provider]  ?ALPRAZolam (XANAX XR) 0.5 MG 24 hr tablet SMARTSIG:0.5 Tablet(s) By Mouth PRN 10/23/21   [provider]  ?ALPRAZolam Duanne Moron) 0.5 MG tablet SMARTSIG:0.5-1 Tablet(s) By Mouth PRN 07/17/21   [provider]  ?azithromycin (ZITHROMAX) 250 MG tablet TK 2 TS PO ON DAY 1, THEN TK 1 T PO D FOR 4 DAYS 08/14/21   [provider]  ?cyclobenzaprine (FLEXERIL) 5 MG tablet Take 5 mg by mouth 3 (three) times daily as needed for muscle spasms.    [provider]  ?fluconazole (DIFLUCAN) 200 MG tablet Take 200 mg by mouth once a week. 12/23/21   [provider]  ?fluticasone-salmeterol (ADVAIR DISKUS) 500-50 MCG/ACT AEPB Inhale into the lungs.    [provider]  ?predniSONE (DELTASONE) 20 MG tablet Take by mouth. 08/14/21   [provider]  ? ? ?Family History ?Family History  ?Problem Relation Age of Onset  ? Breast cancer Sister 17  ? ? ?Social History ?Social History  ? ?Tobacco Use  ? Smoking status: Former  ? Smokeless tobacco: Never  ?Vaping Use  ? Vaping Use: Never used  ?Substance Use Topics  ? Alcohol use: Yes  ? Drug use: No  ? ? ? ?Allergies   ?Patient has no known allergies. ? ? ?Review of Systems ?Review of Systems ?As per HPI ? ?Physical Exam ?Triage Vital Signs ?ED Triage Vitals  ?Enc Vitals Group  ?   BP   ?   Pulse   ?   Resp   ?   Temp   ?   Temp src   ?   SpO2   ?   Weight   ?   Height   ?   Head Circumference   ?   Peak Flow   ?   Pain Score   ?   Pain Loc   ?   Pain Edu?   ?   Excl. in [place]?   ? ?No data found. ? ?Updated Vital Signs ?BP 132/71 (BP Location: Left Arm)   Pulse 85   Temp 98.2 ?F (36.8 ?C) (Oral)   Resp 20    Ht '5\' 3"'$  (  1.6 m)   Wt 88.5 kg   SpO2 97%   BMI 34.54 kg/m?  ? ?Visual Acuity ?Right Eye Distance:   ?Left Eye Distance:   ?Bilateral Distance:   ? ?Right Eye Near:   ?Left Eye Near:    ?Bilateral Near:    ? ?Physical Exam ?Vitals and nursing note reviewed.  ?Constitutional:   ?   General: She is not in acute distress. ?   Appearance: She is not ill-appearing.  ?Cardiovascular:  ?   Rate and Rhythm: Normal rate and regular rhythm.  ?Musculoskeletal:  ?   Comments: Limited range of motion of the interphalangeal joint of the left great toe.  Superficial abrasion on the dorsal aspect of the left great toe just proximal to the nailbed.  Subungual hematoma could not be appreciated because patient has painted her toenails.  Mild bruising of the distal phalanx.  ?Neurological:  ?   Mental Status: She is alert.  ? ? ? ?UC Treatments / Results  ?Labs ?(all labs ordered are listed, but only abnormal results are displayed) ?Labs Reviewed - No data to display ? ?EKG ? ? ?Radiology ?DG Toe Great Left ? ?Result Date: 12/26/2021 ?CLINICAL DATA:  Dropped a bowl on left great toe. EXAM: LEFT GREAT TOE COMPARISON:  None. FINDINGS: Acute nondisplaced fracture involving the medial aspect of the first distal phalanx. No dislocation. Mild first MTP joint osteoarthritis. Soft tissues are unremarkable. IMPRESSION: 1. Acute nondisplaced fracture of the first distal phalanx. Electronically Signed   By: Titus Dubin M.D.   On: 12/26/2021 15:14   ? ?Procedures ?Procedures (including critical care time) ? ?Medications Ordered in UC ?Medications - No data to display ? ?Initial Impression / Assessment and Plan / UC Course  ?I have reviewed the triage vital signs and the nursing notes. ? ?Pertinent labs & imaging results that were available during my care of the patient were reviewed by me and considered in my medical decision making (see chart for details). ? ?  ? ?1.  Closed nondisplaced fracture of the distal phalanx of the left great  toe: ?Postop shoe ?Elevate left foot ?Continue icing left foot ?Ibuprofen 600 mg every 6-8 hours as needed for pain ?Return to urgent care if symptoms worsen ?Over the next several days to a week you are beginning to f

## 2022-12-04 ENCOUNTER — Other Ambulatory Visit: Payer: Self-pay

## 2022-12-04 DIAGNOSIS — Z1231 Encounter for screening mammogram for malignant neoplasm of breast: Secondary | ICD-10-CM

## 2022-12-21 ENCOUNTER — Ambulatory Visit
Admission: RE | Admit: 2022-12-21 | Discharge: 2022-12-21 | Disposition: A | Discharge status: Home / Self Care | Payer: Medicare Other | Source: Ambulatory Visit | Attending: Nurse Practitioner | Admitting: Nurse Practitioner

## 2022-12-21 DIAGNOSIS — Z1231 Encounter for screening mammogram for malignant neoplasm of breast: Secondary | ICD-10-CM | POA: Insufficient documentation

## 2023-01-18 ENCOUNTER — Encounter: Payer: Self-pay | Admitting: Ophthalmology

## 2023-01-19 NOTE — Anesthesia Preprocedure Evaluation (Addendum)
Anesthesia Evaluation  Patient identified by MRN, date of birth, ID band Patient awake    Reviewed: Allergy & Precautions, H&P , NPO status , Patient's Chart, lab work & pertinent test results, reviewed documented beta blocker date and time   Airway Mallampati: II  TM Distance: <3 FB Neck ROM: Full    Dental   Upper bridge that stays in well:   Pulmonary asthma , sleep apnea , former smoker   Pulmonary exam normal breath sounds clear to auscultation       Cardiovascular hypertension, Pt. on home beta blockers and Pt. on medications Normal cardiovascular exam Rhythm:Regular Rate:Normal     Neuro/Psych  PSYCHIATRIC DISORDERS Anxiety Depression    negative neurological ROS  negative psych ROS   GI/Hepatic negative GI ROS, Neg liver ROS,,,  Endo/Other  negative endocrine ROS    Renal/GU negative Renal ROS  negative genitourinary   Musculoskeletal negative musculoskeletal ROS (+) Arthritis ,    Abdominal  (+) + obese  Peds negative pediatric ROS (+)  Hematology negative hematology ROS (+)   Anesthesia Other Findings Hypertension  Asthma Back pain  Back pain Melanoma (H[facility])  Sleep apnea Arthritis  Wears dentures     Reproductive/Obstetrics negative OB ROS                             Anesthesia Physical Anesthesia Plan  ASA: 3  Anesthesia Plan: MAC   Post-op Pain Management:    Induction: Intravenous  PONV Risk Score and Plan:   Airway Management Planned: Natural Airway and Nasal Cannula  Additional Equipment:   Intra-op Plan:   Post-operative Plan:   Informed Consent: I have reviewed the patients History and Physical, chart, labs and discussed the procedure including the risks, benefits and alternatives for the proposed anesthesia with the patient or authorized representative who has indicated his/her understanding and acceptance.     Dental Advisory Given  Plan  Discussed with: Anesthesiologist, CRNA and Surgeon  Anesthesia Plan Comments: (Patient consented for risks of anesthesia including but not limited to:  - adverse reactions to medications - damage to eyes, teeth, lips or other oral mucosa - nerve damage due to positioning  - sore throat or hoarseness - Damage to heart, brain, nerves, lungs, other parts of body or loss of life  Patient voiced understanding.)       Anesthesia Quick Evaluation

## 2023-01-22 NOTE — Discharge Instructions (Signed)

## 2023-01-25 ENCOUNTER — Ambulatory Visit: Payer: Medicare Other | Admitting: Anesthesiology

## 2023-01-25 ENCOUNTER — Ambulatory Visit
Admission: RE | Admit: 2023-01-25 | Discharge: 2023-01-25 | Disposition: A | Discharge status: Home / Self Care | Payer: Medicare Other | Attending: Ophthalmology | Admitting: Ophthalmology

## 2023-01-25 ENCOUNTER — Other Ambulatory Visit: Payer: Self-pay

## 2023-01-25 ENCOUNTER — Encounter: Payer: Self-pay | Admitting: Ophthalmology

## 2023-01-25 ENCOUNTER — Encounter
Admission: RE | Disposition: A | Discharge status: Home / Self Care | Payer: Self-pay | Source: Home / Self Care | Attending: Ophthalmology

## 2023-01-25 DIAGNOSIS — Z87891 Personal history of nicotine dependence: Secondary | ICD-10-CM | POA: Insufficient documentation

## 2023-01-25 DIAGNOSIS — F32A Depression, unspecified: Secondary | ICD-10-CM | POA: Insufficient documentation

## 2023-01-25 DIAGNOSIS — Z6839 Body mass index (BMI) 39.0-39.9, adult: Secondary | ICD-10-CM | POA: Insufficient documentation

## 2023-01-25 DIAGNOSIS — I10 Essential (primary) hypertension: Secondary | ICD-10-CM | POA: Diagnosis not present

## 2023-01-25 DIAGNOSIS — Z85820 Personal history of malignant melanoma of skin: Secondary | ICD-10-CM | POA: Diagnosis not present

## 2023-01-25 DIAGNOSIS — J45909 Unspecified asthma, uncomplicated: Secondary | ICD-10-CM | POA: Insufficient documentation

## 2023-01-25 DIAGNOSIS — E669 Obesity, unspecified: Secondary | ICD-10-CM | POA: Insufficient documentation

## 2023-01-25 DIAGNOSIS — G4730 Sleep apnea, unspecified: Secondary | ICD-10-CM | POA: Diagnosis not present

## 2023-01-25 DIAGNOSIS — H2511 Age-related nuclear cataract, right eye: Secondary | ICD-10-CM | POA: Diagnosis present

## 2023-01-25 DIAGNOSIS — F419 Anxiety disorder, unspecified: Secondary | ICD-10-CM | POA: Insufficient documentation

## 2023-01-25 HISTORY — DX: Unspecified osteoarthritis, unspecified site: M19.90

## 2023-01-25 HISTORY — DX: Presence of dental prosthetic device (complete) (partial): Z97.2

## 2023-01-25 HISTORY — DX: Sleep apnea, unspecified: G47.30

## 2023-01-25 HISTORY — PX: CATARACT EXTRACTION W/PHACO: SHX586

## 2023-01-25 SURGERY — PHACOEMULSIFICATION, CATARACT, WITH IOL INSERTION
Anesthesia: Monitor Anesthesia Care | Laterality: Right

## 2023-01-25 MED ORDER — FENTANYL CITRATE (PF) 100 MCG/2ML IJ SOLN
INTRAMUSCULAR | Status: DC | PRN
  Administered 2023-01-25: 50 ug via INTRAVENOUS

## 2023-01-25 MED ORDER — SIGHTPATH DOSE#1 NA HYALUR & NA CHOND-NA HYALUR IO KIT
PACK | INTRAOCULAR | Status: DC | PRN
  Administered 2023-01-25: 1 via OPHTHALMIC

## 2023-01-25 MED ORDER — ARMC OPHTHALMIC DILATING DROPS
1.0000 | OPHTHALMIC | Status: DC | PRN
  Administered 2023-01-25 (×3): 1 via OPHTHALMIC

## 2023-01-25 MED ORDER — SIGHTPATH DOSE#1 BSS IO SOLN
INTRAOCULAR | Status: DC | PRN
  Administered 2023-01-25: 15 mL via INTRAOCULAR

## 2023-01-25 MED ORDER — SIGHTPATH DOSE#1 BSS IO SOLN
INTRAOCULAR | Status: DC | PRN
  Administered 2023-01-25: 87 mL via OPHTHALMIC

## 2023-01-25 MED ORDER — MIDAZOLAM HCL 2 MG/2ML IJ SOLN
INTRAMUSCULAR | Status: DC | PRN
  Administered 2023-01-25: 2 mg via INTRAVENOUS

## 2023-01-25 MED ORDER — MOXIFLOXACIN HCL 0.5 % OP SOLN
OPHTHALMIC | Status: DC | PRN
  Administered 2023-01-25: .2 mL via OPHTHALMIC

## 2023-01-25 MED ORDER — LIDOCAINE HCL (PF) 2 % IJ SOLN
INTRAOCULAR | Status: DC | PRN
  Administered 2023-01-25: 4 mL via INTRAOCULAR

## 2023-01-25 MED ORDER — TETRACAINE HCL 0.5 % OP SOLN
1.0000 [drp] | OPHTHALMIC | Status: DC | PRN
  Administered 2023-01-25 (×3): 1 [drp] via OPHTHALMIC

## 2023-01-25 MED ORDER — LACTATED RINGERS IV SOLN: INTRAVENOUS | Status: DC

## 2023-01-25 SURGICAL SUPPLY — 12 items
CATARACT SUITE SIGHTPATH (MISCELLANEOUS) ×1 IMPLANT
DISSECTOR HYDRO NUCLEUS 50X22 (MISCELLANEOUS) ×1 IMPLANT
FEE CATARACT SUITE SIGHTPATH (MISCELLANEOUS) ×1 IMPLANT
GLOVE SURG GAMMEX PI TX LF 7.5 (GLOVE) ×1 IMPLANT
GLOVE SURG SYN 8.5  E (GLOVE) ×1
GLOVE SURG SYN 8.5 E (GLOVE) ×1 IMPLANT
GLOVE SURG SYN 8.5 PF PI (GLOVE) ×1 IMPLANT
LENS IOL TECNIS EYHANCE 22.0 (Intraocular Lens) IMPLANT
NDL FILTER BLUNT 18X1 1/2 (NEEDLE) ×1 IMPLANT
NEEDLE FILTER BLUNT 18X1 1/2 (NEEDLE) ×1 IMPLANT
SYR 3ML LL SCALE MARK (SYRINGE) ×1 IMPLANT
SYR 5ML LL (SYRINGE) ×1 IMPLANT

## 2023-01-25 NOTE — H&P (Signed)
[facility]   Primary Care Physician:  Myrene Buddy, NP Ophthalmologist: Dr. Willey Blade  Pre-Procedure History & Physical: HPI:  Terri Hamilton is a 72 y.o. female here for cataract surgery.   Past Medical History:  Diagnosis Date   Arthritis    Asthma    Back pain    Back pain    Hypertension    Melanoma (HCC) 08/09/2014   Resected from Right side of face.   Sleep apnea    CPAP   Wears dentures    partial upper    Past Surgical History:  Procedure Laterality Date   BREAST BIOPSY Right 10/08/2015   INTRADUCTAL PAPILLOMA    BREAST BIOPSY Left 10/08/2015   CYST WALL FRAGMENTS AND BLOOD CLOT.    BREAST CYST ASPIRATION Right 11/26/2020   7 mm benign cyst in the 2 o'clock   BREAST EXCISIONAL BIOPSY Left 1995   neg   BREAST SURGERY     CESAREAN SECTION     COLONOSCOPY WITH PROPOFOL N/A 11/22/2015   Procedure: COLONOSCOPY WITH PROPOFOL;  Surgeon: Christena Deem, MD;  Location: [facility] ENDOSCOPY;  Service: Endoscopy;  Laterality: N/A;   FISSURECTOMY     KNEE SURGERY     TONSILLECTOMY      Prior to Admission medications   Medication Sig Start Date End Date Taking? Authorizing Provider  albuterol (VENTOLIN HFA) 108 (90 Base) MCG/ACT inhaler INHALE 2 INHALATIONS INTO THE LUNGS EVERY 4 HOURS AS NEEDED FOR WHEEZING OR SHORTNESS OF BREATH 12/04/20  Yes [provider]  busPIRone (BUSPAR) 10 MG tablet Take 10 mg by mouth 2 (two) times daily. 12/17/21  Yes [provider]  escitalopram (LEXAPRO) 20 MG tablet Take 20 mg by mouth daily.   Yes [provider]  fluticasone (FLONASE) 50 MCG/ACT nasal spray Place into both nostrils daily as needed.   Yes [provider]  hydrOXYzine (ATARAX) 25 MG tablet Take 25 mg by mouth 2 (two) times daily as needed. 10/26/21  Yes [provider]  ibuprofen (ADVIL) 600 MG tablet Take 1 tablet (600 mg total) by mouth every 8 (eight) hours as needed for moderate pain. 12/26/21  Yes Lamptey,  Britta Mccreedy, MD  lisinopril (PRINIVIL,ZESTRIL) 30 MG tablet Take 10 mg by mouth daily.   Yes [provider]  montelukast (SINGULAIR) 10 MG tablet Take 10 mg by mouth daily. 11/16/21  Yes [provider]  propranolol (INDERAL) 10 MG tablet Take 10 mg by mouth daily.   Yes [provider]  rosuvastatin (CRESTOR) 5 MG tablet Take 5 mg by mouth daily. 10/16/21  Yes [provider]  tiZANidine (ZANAFLEX) 2 MG tablet TAKE 1 TABLET(2 MG) BY MOUTH THREE TIMES DAILY AS NEEDED 01/17/21  Yes [provider]  traZODone (DESYREL) 100 MG tablet Take 50 mg by mouth at bedtime. 08/19/21  Yes [provider]  ALPRAZolam (XANAX XR) 0.5 MG 24 hr tablet SMARTSIG:0.5 Tablet(s) By Mouth PRN Patient not taking: Reported on 01/18/2023 10/23/21   [provider]  ALPRAZolam Prudy Feeler) 0.5 MG tablet SMARTSIG:0.5-1 Tablet(s) By Mouth PRN Patient not taking: Reported on 01/18/2023 07/17/21   [provider]  amLODipine (NORVASC) 10 MG tablet TAKE 1 TABLET(10 MG) BY MOUTH EVERY DAY Patient not taking: Reported on 01/18/2023 07/24/21   [provider]  cyclobenzaprine (FLEXERIL) 5 MG tablet Take 5 mg by mouth 3 (three) times daily as needed for muscle spasms. Patient not taking: Reported on 01/18/2023    [provider]  dicyclomine (BENTYL) 10 MG capsule Take 10 mg by mouth as needed. Patient not taking: Reported on 01/25/2023 04/23/21   [provider]  fluconazole (DIFLUCAN) 200 MG tablet Take 200 mg by mouth once a week. Patient not taking: Reported on 01/18/2023 12/23/21   [provider]  hyoscyamine (LEVSIN) 0.125 MG tablet Take by mouth. Patient not taking: Reported on 01/18/2023 04/23/21   [provider]  triamcinolone (KENALOG) 0.025 % cream Apply topically. Patient not taking: Reported on 01/18/2023 10/18/19   [provider]    Allergies as of 12/31/2022   (No Known Allergies)    Family History   Problem Relation Age of Onset   Breast cancer Sister 69    Social History   Socioeconomic History   Marital status: Divorced    Spouse name: Not on file   Number of children: Not on file   Years of education: Not on file   Highest education level: Not on file  Occupational History   Not on file  Tobacco Use   Smoking status: Former    Types: Cigarettes    Quit date: 1980    Years since quitting: 44.4   Smokeless tobacco: Never  Vaping Use   Vaping Use: Never used  Substance and Sexual Activity   Alcohol use: Yes    Alcohol/week: 6.0 standard drinks of alcohol    Types: 6 Standard drinks or equivalent per week   Drug use: No   Sexual activity: Not on file  Other Topics Concern   Not on file  Social History Narrative   Not on file   Social Determinants of Health   Financial Resource Strain: Not on file  Food Insecurity: Not on file  Transportation Needs: Not on file  Physical Activity: Not on file  Stress: Not on file  Social Connections: Not on file  Intimate Partner Violence: Not on file    Review of Systems: See HPI, otherwise negative ROS  Physical Exam: BP (!) 145/77   Temp (!) 97.3 F (36.3 C) (Temporal)   Resp 12   Ht 5\' 3"  (1.6 m)   Wt 100.4 kg   SpO2 95%   BMI 39.22 kg/m  General:   Alert, cooperative in NAD Head:  Normocephalic and atraumatic. Respiratory:  Normal work of breathing. Cardiovascular:  RRR  Impression/Plan: Terri Hamilton is here for cataract surgery.  Risks, benefits, limitations, and alternatives regarding cataract surgery have been reviewed with the patient.  Questions have been answered.  All parties agreeable.   Willey Blade, MD  01/25/2023, 10:18 AM

## 2023-01-25 NOTE — Transfer of Care (Signed)
Immediate Anesthesia Transfer of Care Note  Patient: Terri Hamilton  Procedure(s) Performed: CATARACT EXTRACTION PHACO AND INTRAOCULAR LENS PLACEMENT (IOC) RIGHT 3.60 00:32.5 (Right)  Patient Location: PACU  Anesthesia Type: MAC  Level of Consciousness: awake, alert  and patient cooperative  Airway and Oxygen Therapy: Patient Spontanous Breathing and Patient connected to supplemental oxygen  Post-op Assessment: Post-op Vital signs reviewed, Patient's Cardiovascular Status Stable, Respiratory Function Stable, Patent Airway and No signs of Nausea or vomiting  Post-op Vital Signs: Reviewed and stable  Complications: No notable events documented.

## 2023-01-25 NOTE — Anesthesia Postprocedure Evaluation (Signed)
Anesthesia Post Note  Patient: Terri Hamilton  Procedure(s) Performed: CATARACT EXTRACTION PHACO AND INTRAOCULAR LENS PLACEMENT (IOC) RIGHT 3.60 00:32.5 (Right)  Patient location during evaluation: PACU Anesthesia Type: MAC Level of consciousness: awake and alert Pain management: pain level controlled Vital Signs Assessment: post-procedure vital signs reviewed and stable Respiratory status: spontaneous breathing, nonlabored ventilation, respiratory function stable and patient connected to nasal cannula oxygen Cardiovascular status: stable and blood pressure returned to baseline Postop Assessment: no apparent nausea or vomiting Anesthetic complications: no   No notable events documented.   Last Vitals:  Vitals:   01/25/23 1046 01/25/23 1052  BP: 104/77 120/89  Pulse: (!) 54 (!) 53  Resp: 12 16  Temp: (!) 36.3 C (!) 36.3 C  SpO2: 93% 93%    Last Pain:  Vitals:   01/25/23 1052  TempSrc:   PainSc: 0-No pain                 Pete Merten C [place]

## 2023-01-25 NOTE — Op Note (Signed)
OPERATIVE NOTE  Terri Hamilton [id number] 01/25/2023   PREOPERATIVE DIAGNOSIS:  Nuclear sclerotic cataract right eye.  H25.11   POSTOPERATIVE DIAGNOSIS:    Nuclear sclerotic cataract right eye.     PROCEDURE:  Phacoemusification with posterior chamber intraocular lens placement of the right eye   LENS:   Implant Name Type Inv. Item Serial No. Manufacturer Lot No. LRB No. Used Action  LENS IOL TECNIS EYHANCE 22.0 - [id number] Intraocular Lens LENS IOL TECNIS EYHANCE 22.0 [id number] [employer]  Right 1 Implanted       Procedure(s) with comments: CATARACT EXTRACTION PHACO AND INTRAOCULAR LENS PLACEMENT (IOC) RIGHT 3.60 00:32.5 (Right) - sleep apnea  DIB00 +22.0   ULTRASOUND TIME: 0 minutes 32 seconds.  CDE 3.60   SURGEON:  Willey Blade, MD, MPH  ANESTHESIOLOGIST: Anesthesiologist: Marisue Humble, MD CRNA: [place], [place], CRNA   ANESTHESIA:  Topical with tetracaine drops augmented with 1% preservative-free intracameral lidocaine.  ESTIMATED BLOOD LOSS: less than 1 mL.   COMPLICATIONS:  None.   DESCRIPTION OF PROCEDURE:  The patient was identified in the holding room and transported to the operating room and placed in the supine position under the operating microscope.  The right eye was identified as the operative eye and it was prepped and draped in the usual sterile ophthalmic fashion.   A 1.0 millimeter clear-corneal paracentesis was made at the 10:30 position. 0.5 ml of preservative-free 1% lidocaine with epinephrine was injected into the anterior chamber.  The anterior chamber was filled with viscoelastic.  A 2.4 millimeter keratome was used to make a near-clear corneal incision at the 8:00 position.  A curvilinear capsulorrhexis was made with a cystotome and capsulorrhexis forceps.  Balanced salt solution was used to hydrodissect and hydrodelineate the nucleus.   Phacoemulsification was then used in stop and chop fashion to remove the lens nucleus and epinucleus.  The  remaining cortex was then removed using the irrigation and aspiration handpiece. Viscoelastic was then placed into the capsular bag to distend it for lens placement.  A lens was then injected into the capsular bag.  The remaining viscoelastic was aspirated.   Wounds were hydrated with balanced salt solution.  The anterior chamber was inflated to a physiologic pressure with balanced salt solution.   Intracameral vigamox 0.1 mL undiluted was injected into the eye and a drop placed onto the ocular surface.  No wound leaks were noted.  The patient was taken to the recovery room in stable condition without complications of anesthesia or surgery  Willey Blade 01/25/2023, 10:44 AM

## 2023-01-26 ENCOUNTER — Encounter: Payer: Self-pay | Admitting: Ophthalmology

## 2023-01-27 ENCOUNTER — Encounter: Payer: Self-pay | Admitting: Ophthalmology

## 2023-02-02 NOTE — Anesthesia Preprocedure Evaluation (Addendum)
Anesthesia Evaluation  Patient identified by MRN, date of birth, ID band Patient awake    Reviewed: Allergy & Precautions, H&P , NPO status , Patient's Chart, lab work & pertinent test results  Airway Mallampati: II  TM Distance: <3 FB Neck ROM: Full    Dental no notable dental hx.  Upper bridge that stays in well:   Pulmonary asthma , sleep apnea , former smoker   Pulmonary exam normal breath sounds clear to auscultation       Cardiovascular hypertension, Normal cardiovascular exam Rhythm:Regular Rate:Normal     Neuro/Psych  PSYCHIATRIC DISORDERS Anxiety Depression    negative neurological ROS  negative psych ROS   GI/Hepatic negative GI ROS, Neg liver ROS,,,  Endo/Other  negative endocrine ROS    Renal/GU negative Renal ROS  negative genitourinary   Musculoskeletal negative musculoskeletal ROS (+) Arthritis ,    Abdominal   Peds negative pediatric ROS (+)  Hematology negative hematology ROS (+)   Anesthesia Other Findings Hypertension Asthma Back pain  Melanoma  Sleep apnea Arthritis  Wears dentures    Reproductive/Obstetrics negative OB ROS                              Anesthesia Physical Anesthesia Plan  ASA: 3  Anesthesia Plan: MAC   Post-op Pain Management:    Induction: Intravenous  PONV Risk Score and Plan:   Airway Management Planned: Natural Airway and Nasal Cannula  Additional Equipment:   Intra-op Plan:   Post-operative Plan:   Informed Consent: I have reviewed the patients History and Physical, chart, labs and discussed the procedure including the risks, benefits and alternatives for the proposed anesthesia with the patient or authorized representative who has indicated his/her understanding and acceptance.     Dental Advisory Given  Plan Discussed with: Anesthesiologist, CRNA and Surgeon  Anesthesia Plan Comments: (Patient consented for risks of  anesthesia including but not limited to:  - adverse reactions to medications - damage to eyes, teeth, lips or other oral mucosa - nerve damage due to positioning  - sore throat or hoarseness - Damage to heart, brain, nerves, lungs, other parts of body or loss of life  Patient voiced understanding.)         Anesthesia Quick Evaluation

## 2023-02-04 NOTE — Discharge Instructions (Signed)

## 2023-02-08 ENCOUNTER — Other Ambulatory Visit: Payer: Self-pay

## 2023-02-08 ENCOUNTER — Ambulatory Visit: Payer: Medicare Other | Admitting: Anesthesiology

## 2023-02-08 ENCOUNTER — Encounter: Payer: Self-pay | Admitting: Ophthalmology

## 2023-02-08 ENCOUNTER — Ambulatory Visit
Admission: RE | Admit: 2023-02-08 | Discharge: 2023-02-08 | Disposition: A | Discharge status: Home / Self Care | Payer: Medicare Other | Attending: Ophthalmology | Admitting: Ophthalmology

## 2023-02-08 ENCOUNTER — Encounter
Admission: RE | Disposition: A | Discharge status: Home / Self Care | Payer: Self-pay | Source: Home / Self Care | Attending: Ophthalmology

## 2023-02-08 DIAGNOSIS — H2512 Age-related nuclear cataract, left eye: Secondary | ICD-10-CM | POA: Insufficient documentation

## 2023-02-08 DIAGNOSIS — I10 Essential (primary) hypertension: Secondary | ICD-10-CM | POA: Insufficient documentation

## 2023-02-08 DIAGNOSIS — Z87891 Personal history of nicotine dependence: Secondary | ICD-10-CM | POA: Diagnosis not present

## 2023-02-08 HISTORY — PX: CATARACT EXTRACTION W/PHACO: SHX586

## 2023-02-08 SURGERY — PHACOEMULSIFICATION, CATARACT, WITH IOL INSERTION
Anesthesia: Monitor Anesthesia Care | Site: Eye | Laterality: Left

## 2023-02-08 MED ORDER — SIGHTPATH DOSE#1 NA HYALUR & NA CHOND-NA HYALUR IO KIT
PACK | INTRAOCULAR | Status: DC | PRN
  Administered 2023-02-08: 1 via OPHTHALMIC

## 2023-02-08 MED ORDER — MIDAZOLAM HCL 2 MG/2ML IJ SOLN
INTRAMUSCULAR | Status: DC | PRN
  Administered 2023-02-08: 2 mg via INTRAVENOUS

## 2023-02-08 MED ORDER — MOXIFLOXACIN HCL 0.5 % OP SOLN
OPHTHALMIC | Status: DC | PRN
  Administered 2023-02-08: .2 mL via OPHTHALMIC

## 2023-02-08 MED ORDER — LIDOCAINE HCL (PF) 2 % IJ SOLN
INTRAOCULAR | Status: DC | PRN
  Administered 2023-02-08: 1 mL via INTRAOCULAR

## 2023-02-08 MED ORDER — SIGHTPATH DOSE#1 BSS IO SOLN
INTRAOCULAR | Status: DC | PRN
  Administered 2023-02-08: 75 mL via OPHTHALMIC

## 2023-02-08 MED ORDER — ARMC OPHTHALMIC DILATING DROPS
1.0000 | OPHTHALMIC | Status: DC | PRN
  Administered 2023-02-08 (×3): 1 via OPHTHALMIC

## 2023-02-08 MED ORDER — TETRACAINE HCL 0.5 % OP SOLN
1.0000 [drp] | OPHTHALMIC | Status: DC | PRN
  Administered 2023-02-08 (×3): 1 [drp] via OPHTHALMIC

## 2023-02-08 MED ORDER — SIGHTPATH DOSE#1 BSS IO SOLN
INTRAOCULAR | Status: DC | PRN
  Administered 2023-02-08: 15 mL

## 2023-02-08 MED ORDER — FENTANYL CITRATE (PF) 100 MCG/2ML IJ SOLN
INTRAMUSCULAR | Status: DC | PRN
  Administered 2023-02-08: 100 ug via INTRAVENOUS

## 2023-02-08 MED ORDER — LACTATED RINGERS IV SOLN: INTRAVENOUS | Status: DC

## 2023-02-08 SURGICAL SUPPLY — 11 items
CATARACT SUITE SIGHTPATH (MISCELLANEOUS) ×1 IMPLANT
DISSECTOR HYDRO NUCLEUS 50X22 (MISCELLANEOUS) ×1 IMPLANT
FEE CATARACT SUITE SIGHTPATH (MISCELLANEOUS) ×1 IMPLANT
GLOVE SURG GAMMEX PI TX LF 7.5 (GLOVE) ×1 IMPLANT
GLOVE SURG SYN 8.5 E (GLOVE) ×1 IMPLANT
GLOVE SURG SYN 8.5 PF PI (GLOVE) ×1 IMPLANT
LENS IOL TECNIS EYHANCE 23.0 (Intraocular Lens) IMPLANT
NDL FILTER BLUNT 18X1 1/2 (NEEDLE) ×1 IMPLANT
NEEDLE FILTER BLUNT 18X1 1/2 (NEEDLE) ×1 IMPLANT
SYR 3ML LL SCALE MARK (SYRINGE) ×1 IMPLANT
SYR 5ML LL (SYRINGE) ×1 IMPLANT

## 2023-02-08 NOTE — Op Note (Signed)
OPERATIVE NOTE  Terri Hamilton [id number] 02/08/2023   PREOPERATIVE DIAGNOSIS:  Nuclear sclerotic cataract left eye.  H25.12   POSTOPERATIVE DIAGNOSIS:    Nuclear sclerotic cataract left eye.     PROCEDURE:  Phacoemusification with posterior chamber intraocular lens placement of the left eye   LENS:   Implant Name Type Inv. Item Serial No. Manufacturer Lot No. LRB No. Used Action  LENS IOL TECNIS EYHANCE 23.0 - [id number] Intraocular Lens LENS IOL TECNIS EYHANCE 23.0 [id number] [employer]  Left 1 Implanted      Procedure(s) with comments: CATARACT EXTRACTION PHACO AND INTRAOCULAR LENS PLACEMENT (IOC) LEFT  5.00  00:35.4 (Left) - sleep apnea  DIB00 +23.0   ULTRASOUND TIME: 0 minutes 35 seconds.  CDE 5.0   SURGEON:  Willey Blade, MD, MPH   ANESTHESIA:  Topical with tetracaine drops augmented with 1% preservative-free intracameral lidocaine.  ESTIMATED BLOOD LOSS: <1 mL   COMPLICATIONS:  None.   DESCRIPTION OF PROCEDURE:  The patient was identified in the holding room and transported to the operating room and placed in the supine position under the operating microscope.  The left eye was identified as the operative eye and it was prepped and draped in the usual sterile ophthalmic fashion.   A 1.0 millimeter clear-corneal paracentesis was made at the 5:00 position. 0.5 ml of preservative-free 1% lidocaine with epinephrine was injected into the anterior chamber.  The anterior chamber was filled with viscoelastic.  A 2.4 millimeter keratome was used to make a near-clear corneal incision at the 2:00 position.  A curvilinear capsulorrhexis was made with a cystotome and capsulorrhexis forceps.  Balanced salt solution was used to hydrodissect and hydrodelineate the nucleus.   Phacoemulsification was then used in stop and chop fashion to remove the lens nucleus and epinucleus.  The remaining cortex was then removed using the irrigation and aspiration handpiece. Viscoelastic was then  placed into the capsular bag to distend it for lens placement.  A lens was then injected into the capsular bag.  The remaining viscoelastic was aspirated.   Wounds were hydrated with balanced salt solution.  The anterior chamber was inflated to a physiologic pressure with balanced salt solution.  Intracameral vigamox 0.1 mL undiltued was injected into the eye and a drop placed onto the ocular surface.  No wound leaks were noted.  The patient was taken to the recovery room in stable condition without complications of anesthesia or surgery  Willey Blade 02/08/2023, 1:10 PM

## 2023-02-08 NOTE — Transfer of Care (Signed)
Immediate Anesthesia Transfer of Care Note  Patient: Terri Hamilton  Procedure(s) Performed: CATARACT EXTRACTION PHACO AND INTRAOCULAR LENS PLACEMENT (IOC) LEFT  5.00  00:35.4 (Left: Eye)  Patient Location: PACU  Anesthesia Type: MAC  Level of Consciousness: awake, alert  and patient cooperative  Airway and Oxygen Therapy: Patient Spontanous Breathing and Patient connected to supplemental oxygen  Post-op Assessment: Post-op Vital signs reviewed, Patient's Cardiovascular Status Stable, Respiratory Function Stable, Patent Airway and No signs of Nausea or vomiting  Post-op Vital Signs: Reviewed and stable  Complications: No notable events documented.

## 2023-02-08 NOTE — H&P (Signed)
[facility]   Primary Care Physician:  Myrene Buddy, NP Ophthalmologist: Dr. Willey Blade  Pre-Procedure History & Physical: HPI:  Terri Hamilton is a 72 y.o. female here for cataract surgery.   Past Medical History:  Diagnosis Date   Arthritis    Asthma    Back pain    Back pain    Hypertension    Melanoma (HCC) 08/09/2014   Resected from Right side of face.   Sleep apnea    CPAP   Wears dentures    partial upper    Past Surgical History:  Procedure Laterality Date   BREAST BIOPSY Right 10/08/2015   INTRADUCTAL PAPILLOMA    BREAST BIOPSY Left 10/08/2015   CYST WALL FRAGMENTS AND BLOOD CLOT.    BREAST CYST ASPIRATION Right 11/26/2020   7 mm benign cyst in the 2 o'clock   BREAST EXCISIONAL BIOPSY Left 1995   neg   BREAST SURGERY     CATARACT EXTRACTION W/PHACO Right 01/25/2023   Procedure: CATARACT EXTRACTION PHACO AND INTRAOCULAR LENS PLACEMENT (IOC) RIGHT 3.60 00:32.5;  Surgeon: Nevada Crane, MD;  Location: [facility];  Service: Ophthalmology;  Laterality: Right;  sleep apnea   CESAREAN SECTION     COLONOSCOPY WITH PROPOFOL N/A 11/22/2015   Procedure: COLONOSCOPY WITH PROPOFOL;  Surgeon: Christena Deem, MD;  Location: [facility] ENDOSCOPY;  Service: Endoscopy;  Laterality: N/A;   FISSURECTOMY     KNEE SURGERY     TONSILLECTOMY      Prior to Admission medications   Medication Sig Start Date End Date Taking? Authorizing Provider  busPIRone (BUSPAR) 10 MG tablet Take 10 mg by mouth 2 (two) times daily. 12/17/21  Yes [provider]  escitalopram (LEXAPRO) 20 MG tablet Take 20 mg by mouth daily.   Yes [provider]  hydrOXYzine (ATARAX) 25 MG tablet Take 25 mg by mouth 2 (two) times daily as needed. 10/26/21  Yes [provider]  hyoscyamine (LEVSIN) 0.125 MG tablet Take by mouth. 04/23/21  Yes [provider]  lisinopril (PRINIVIL,ZESTRIL) 30 MG tablet Take 10 mg by mouth daily.   Yes [provider]  montelukast (SINGULAIR) 10 MG tablet Take 10 mg by mouth daily. 11/16/21  Yes [provider]  propranolol (INDERAL) 10 MG tablet Take 10 mg by mouth daily.   Yes [provider]  rosuvastatin (CRESTOR) 5 MG tablet Take 5 mg by mouth daily. 10/16/21  Yes [provider]  tiZANidine (ZANAFLEX) 2 MG tablet TAKE 1 TABLET(2 MG) BY MOUTH THREE TIMES DAILY AS NEEDED 01/17/21  Yes [provider]  traZODone (DESYREL) 100 MG tablet Take 50 mg by mouth at bedtime. 08/19/21  Yes [provider]  albuterol (VENTOLIN HFA) 108 (90 Base) MCG/ACT inhaler INHALE 2 INHALATIONS INTO THE LUNGS EVERY 4 HOURS AS NEEDED FOR WHEEZING OR SHORTNESS OF BREATH 12/04/20   [provider]  ALPRAZolam (XANAX XR) 0.5 MG 24 hr tablet SMARTSIG:0.5 Tablet(s) By Mouth PRN Patient not taking: Reported on 01/18/2023 10/23/21   [provider]  ALPRAZolam Prudy Feeler) 0.5 MG tablet SMARTSIG:0.5-1 Tablet(s) By Mouth PRN Patient not taking: Reported on 01/18/2023 07/17/21   [provider]  amLODipine (NORVASC) 10 MG tablet TAKE 1 TABLET(10 MG) BY MOUTH EVERY DAY Patient not taking: Reported on 01/18/2023 07/24/21   [provider]  cyclobenzaprine (FLEXERIL) 5 MG tablet Take 5 mg by mouth 3 (three) times daily as needed for muscle spasms. Patient not taking: Reported on 01/18/2023  [provider]  dicyclomine (BENTYL) 10 MG capsule Take 10 mg by mouth as needed. Patient not taking: Reported on 01/25/2023 04/23/21   [provider]  fluconazole (DIFLUCAN) 200 MG tablet Take 200 mg by mouth once a week. Patient not taking: Reported on 01/18/2023 12/23/21   [provider]  fluticasone (FLONASE) 50 MCG/ACT nasal spray Place into both nostrils daily as needed.    [provider]  ibuprofen (ADVIL) 600 MG tablet Take 1 tablet (600 mg total) by mouth every 8 (eight) hours as needed for moderate pain. 12/26/21   Merrilee Jansky, MD  triamcinolone (KENALOG) 0.025 % cream Apply topically. Patient not taking: Reported on 01/18/2023 10/18/19   [provider]    Allergies as of 12/31/2022   (No Known Allergies)    Family History  Problem Relation Age of Onset   Breast cancer Sister 89    Social History   Socioeconomic History   Marital status: Divorced    Spouse name: Not on file   Number of children: Not on file   Years of education: Not on file   Highest education level: Not on file  Occupational History   Not on file  Tobacco Use   Smoking status: Former    Types: Cigarettes    Quit date: 1980    Years since quitting: 44.4   Smokeless tobacco: Never  Vaping Use   Vaping Use: Never used  Substance and Sexual Activity   Alcohol use: Yes    Alcohol/week: 6.0 standard drinks of alcohol    Types: 6 Standard drinks or equivalent per week   Drug use: No   Sexual activity: Not on file  Other Topics Concern   Not on file  Social History Narrative   Not on file   Social Determinants of Health   Financial Resource Strain: Not on file  Food Insecurity: Not on file  Transportation Needs: Not on file  Physical Activity: Not on file  Stress: Not on file  Social Connections: Not on file  Intimate Partner Violence: Not on file    Review of Systems: See HPI, otherwise negative ROS  Physical Exam: BP 133/70   Temp 98 F (36.7 C) (Temporal)   Resp 20   Ht 5' 2.99" (1.6 m)   Wt 101 kg   SpO2 94%   BMI 39.46 kg/m  General:   Alert, cooperative in NAD Head:  Normocephalic and atraumatic. Respiratory:  Normal work of breathing. Cardiovascular:  RRR  Impression/Plan: Terri Hamilton is here for cataract surgery.  Risks, benefits, limitations, and alternatives regarding cataract surgery have been reviewed with the patient.  Questions have been answered.  All parties agreeable.   Willey Blade, MD  02/08/2023, 12:41 PM

## 2023-02-08 NOTE — Anesthesia Postprocedure Evaluation (Signed)
Anesthesia Post Note  Patient: Elizbeth Gauthier  Procedure(s) Performed: CATARACT EXTRACTION PHACO AND INTRAOCULAR LENS PLACEMENT (IOC) LEFT  5.00  00:35.4 (Left: Eye)  Patient location during evaluation: PACU Anesthesia Type: MAC Level of consciousness: awake and alert Pain management: pain level controlled Vital Signs Assessment: post-procedure vital signs reviewed and stable Respiratory status: spontaneous breathing, nonlabored ventilation, respiratory function stable and patient connected to nasal cannula oxygen Cardiovascular status: stable and blood pressure returned to baseline Postop Assessment: no apparent nausea or vomiting Anesthetic complications: no   No notable events documented.   Last Vitals:  Vitals:   02/08/23 1311 02/08/23 1316  BP: 139/85 (!) 107/59  Pulse: 66 71  Resp: 14 20  Temp: 36.7 C 36.7 C  SpO2: 94% 94%    Last Pain:  Vitals:   02/08/23 1316  TempSrc:   PainSc: 0-No pain                 Shanea Karney C Sadler Teschner

## 2023-02-09 ENCOUNTER — Encounter: Payer: Self-pay | Admitting: Ophthalmology

## 2023-04-29 ENCOUNTER — Ambulatory Visit
Admission: EM | Admit: 2023-04-29 | Discharge: 2023-04-29 | Disposition: A | Discharge status: Home / Self Care | Payer: Medicare Other | Attending: Internal Medicine | Admitting: Internal Medicine

## 2023-04-29 DIAGNOSIS — L600 Ingrowing nail: Secondary | ICD-10-CM | POA: Diagnosis not present

## 2023-04-29 MED ORDER — DOXYCYCLINE HYCLATE 100 MG PO CAPS: 100.0000 mg | ORAL_CAPSULE | Freq: Two times a day (BID) | ORAL | 0 refills | Status: AC

## 2023-04-29 NOTE — Discharge Instructions (Addendum)
Please continue soaking your feet in warm Epsom salt water Please take antibiotics as directed Please keep your appointment with the podiatrist for further management You may try ibuprofen as needed for pain. If you have worsening symptoms please return to urgent care to be reevaluated

## 2023-04-29 NOTE — ED Triage Notes (Signed)
Patient presents to UC for left great toe pain x 10 days. States she had a pedicure x 2 months ago states after she developed pain. Treating pain with Tylenol arthritis. She has been soaking toe and cleaning with peroxide.

## 2023-04-29 NOTE — ED Provider Notes (Signed)
[facility]    CSN: [id number] Arrival date & time: 04/29/23  1112      History   Chief Complaint Chief Complaint  Patient presents with   Toe Pain    HPI Terri Hamilton is a 72 y.o. female comes to urgent care with 10-day history of left great toe pain.  Patient reached out to her primary physician but could not be seen until September 4 hence the visit to the urgent care.  Toe pain is throbbing cough and of moderate severity.  Pain is aggravated by palpation.  He denies any relieving factors.  There is no radiation of pain.  Is associated with some redness of the left great toe.  Patient denies any history of gout.  She has toenails that have curved inwards and digging into the skin.  No fever or chills.   HPI  Past Medical History:  Diagnosis Date   Arthritis    Asthma    Back pain    Back pain    Hypertension    Melanoma (HCC) 08/09/2014   Resected from Right side of face.   Sleep apnea    CPAP   Wears dentures    partial upper    Patient Active Problem List   Diagnosis Date Noted   Sleep apnea 12/26/2021   Obesity (BMI 30.0-34.9) 12/26/2021   Impaired glucose tolerance 12/26/2021   Hyperlipidemia, mild 12/26/2021   Asthma without status asthmaticus 12/26/2021   Arthritis 12/26/2021   Anxiety 12/26/2021   Allergic rhinitis 12/26/2021   Arthritis of left glenohumeral joint 11/26/2020   Hypertension 09/17/2015   Depression, unspecified 09/17/2015   Melanoma in situ (HCC) 08/09/2014    Past Surgical History:  Procedure Laterality Date   BREAST BIOPSY Right 10/08/2015   INTRADUCTAL PAPILLOMA    BREAST BIOPSY Left 10/08/2015   CYST WALL FRAGMENTS AND BLOOD CLOT.    BREAST CYST ASPIRATION Right 11/26/2020   7 mm benign cyst in the 2 o'clock   BREAST EXCISIONAL BIOPSY Left 1995   neg   BREAST SURGERY     CATARACT EXTRACTION W/PHACO Right 01/25/2023   Procedure: CATARACT EXTRACTION PHACO AND INTRAOCULAR LENS PLACEMENT (IOC) RIGHT 3.60  00:32.5;  Surgeon: Nevada Crane, MD;  Location: [facility];  Service: Ophthalmology;  Laterality: Right;  sleep apnea   CATARACT EXTRACTION W/PHACO Left 02/08/2023   Procedure: CATARACT EXTRACTION PHACO AND INTRAOCULAR LENS PLACEMENT (IOC) LEFT  5.00  00:35.4;  Surgeon: Nevada Crane, MD;  Location: [facility];  Service: Ophthalmology;  Laterality: Left;  sleep apnea   CESAREAN SECTION     COLONOSCOPY WITH PROPOFOL N/A 11/22/2015   Procedure: COLONOSCOPY WITH PROPOFOL;  Surgeon: Christena Deem, MD;  Location: [facility];  Service: Endoscopy;  Laterality: N/A;   FISSURECTOMY     KNEE SURGERY     TONSILLECTOMY      OB History     Gravida  1   Para  1   Term      Preterm      AB      Living         SAB      IAB      Ectopic      Multiple      Live Births               Home Medications    Prior to Admission medications   Medication Sig Start Date End Date Taking? Authorizing Provider  doxycycline (  VIBRAMYCIN) 100 MG capsule Take 1 capsule (100 mg total) by mouth 2 (two) times daily for 7 days. 04/29/23 05/06/23 Yes Shaleah Nissley, Britta Mccreedy, MD  albuterol (VENTOLIN HFA) 108 (90 Base) MCG/ACT inhaler INHALE 2 INHALATIONS INTO THE LUNGS EVERY 4 HOURS AS NEEDED FOR WHEEZING OR SHORTNESS OF BREATH 12/04/20   [provider]  busPIRone (BUSPAR) 10 MG tablet Take 10 mg by mouth 2 (two) times daily. 12/17/21   [provider]  escitalopram (LEXAPRO) 20 MG tablet Take 20 mg by mouth daily.    [provider]  fluticasone (FLONASE) 50 MCG/ACT nasal spray Place into both nostrils daily as needed.    [provider]  hydrOXYzine (ATARAX) 25 MG tablet Take 25 mg by mouth 2 (two) times daily as needed. 10/26/21   [provider]  hyoscyamine (LEVSIN) 0.125 MG tablet Take by mouth. 04/23/21   [provider]  ibuprofen (ADVIL) 600 MG tablet Take 1 tablet (600 mg total) by mouth every 8 (eight) hours as  needed for moderate pain. 12/26/21   Merrilee Jansky, MD  lisinopril (PRINIVIL,ZESTRIL) 30 MG tablet Take 10 mg by mouth daily.    [provider]  montelukast (SINGULAIR) 10 MG tablet Take 10 mg by mouth daily. 11/16/21   [provider]  propranolol (INDERAL) 10 MG tablet Take 10 mg by mouth daily.    [provider]  rosuvastatin (CRESTOR) 5 MG tablet Take 5 mg by mouth daily. 10/16/21   [provider]  tiZANidine (ZANAFLEX) 2 MG tablet TAKE 1 TABLET(2 MG) BY MOUTH THREE TIMES DAILY AS NEEDED 01/17/21   [provider]  traZODone (DESYREL) 100 MG tablet Take 50 mg by mouth at bedtime. 08/19/21   [provider]  triamcinolone (KENALOG) 0.025 % cream Apply topically. Patient not taking: Reported on 01/18/2023 10/18/19   [provider]    Family History Family History  Problem Relation Age of Onset   Breast cancer Sister 17    Social History Social History   Tobacco Use   Smoking status: Former    Current packs/day: 0.00    Types: Cigarettes    Quit date: 1980    Years since quitting: 44.6   Smokeless tobacco: Never  Vaping Use   Vaping status: Never Used  Substance Use Topics   Alcohol use: Yes    Alcohol/week: 6.0 standard drinks of alcohol    Types: 6 Standard drinks or equivalent per week   Drug use: No     Allergies   Patient has no known allergies.   Review of Systems Review of Systems As per HPI  Physical Exam Triage Vital Signs ED Triage Vitals  Encounter Vitals Group     BP 04/29/23 1130 (!) 161/83     Systolic BP Percentile --      Diastolic BP Percentile --      Pulse Rate 04/29/23 1130 (!) 59     Resp 04/29/23 1130 16     Temp 04/29/23 1130 98.4 F (36.9 C)     Temp Source 04/29/23 1130 Oral     SpO2 04/29/23 1130 97 %     Weight --      Height --      Head Circumference --      Peak Flow --      Pain Score 04/29/23 1129 0     Pain Loc --      Pain Education --      Exclude from  [employer]  Chart --    No data found.  Updated Vital Signs BP (!) 149/92 (BP Location: Left Arm)   Pulse (!) 59   Temp 98.4 F (36.9 C) (Oral)   Resp 16   SpO2 97%   Visual Acuity Right Eye Distance:   Left Eye Distance:   Bilateral Distance:    Right Eye Near:   Left Eye Near:    Bilateral Near:     Physical Exam Vitals and nursing note reviewed.  Constitutional:      General: She is not in acute distress.    Appearance: She is not ill-appearing.  Cardiovascular:     Rate and Rhythm: Normal rate and regular rhythm.     Pulses: Normal pulses.     Heart sounds: Normal heart sounds.  Pulmonary:     Effort: Pulmonary effort is normal.     Breath sounds: Normal breath sounds.  Musculoskeletal:     Comments: Left great toe swelling with erythema over the medial and lateral nail folds.  No discharge noted.  Ingrown toenail involving the left great toe.  Neurological:     Mental Status: She is alert.      UC Treatments / Results  Labs (all labs ordered are listed, but only abnormal results are displayed) Labs Reviewed - No data to display  EKG   Radiology No results found.  Procedures Procedures (including critical care time)  Medications Ordered in UC Medications - No data to display  Initial Impression / Assessment and Plan / UC Course  I have reviewed the triage vital signs and the nursing notes.  Pertinent labs & imaging results that were available during my care of the patient were reviewed by me and considered in my medical decision making (see chart for details).     1.  Infected ingrown toenail of the great toe of the left foot: Warm Epsom salt water soaks Doxycycline 100 mg twice daily Ibuprofen as needed for pain Follow-up with podiatry Return precautions given Final Clinical Impressions(s) / UC Diagnoses   Final diagnoses:  Ingrown nail of great toe of left foot     Discharge Instructions      Please continue soaking your feet in warm  Epsom salt water Please take antibiotics as directed Please keep your appointment with the podiatrist for further management You may try ibuprofen as needed for pain. If you have worsening symptoms please return to urgent care to be reevaluated   ED Prescriptions     Medication Sig Dispense Auth. Provider   doxycycline (VIBRAMYCIN) 100 MG capsule Take 1 capsule (100 mg total) by mouth 2 (two) times daily for 7 days. 14 capsule Kanchan Gal, Britta Mccreedy, MD      PDMP not reviewed this encounter.   Merrilee Jansky, MD 04/29/23 [phone]

## 2024-01-25 ENCOUNTER — Other Ambulatory Visit: Payer: Self-pay | Admitting: Nurse Practitioner

## 2024-01-25 DIAGNOSIS — Z1231 Encounter for screening mammogram for malignant neoplasm of breast: Secondary | ICD-10-CM

## 2024-02-17 ENCOUNTER — Ambulatory Visit
Admission: RE | Admit: 2024-02-17 | Discharge: 2024-02-17 | Disposition: A | Discharge status: Home / Self Care | Source: Ambulatory Visit | Attending: Nurse Practitioner | Admitting: Nurse Practitioner

## 2024-02-17 DIAGNOSIS — Z1231 Encounter for screening mammogram for malignant neoplasm of breast: Secondary | ICD-10-CM | POA: Insufficient documentation
